# Patient Record
Sex: Female | Born: 1970 | Race: Black or African American | Hispanic: No | Marital: Married | Smoking: Never smoker
Health system: Southern US, Community
[De-identification: ages and names within clinical notes are randomized; demographics above are authoritative.]

---

## 1998-09-30 ENCOUNTER — Other Ambulatory Visit: Admission: RE | Admit: 1998-09-30 | Discharge: 1998-09-30 | Payer: Self-pay | Admitting: Obstetrics and Gynecology

## 1999-01-15 ENCOUNTER — Ambulatory Visit (HOSPITAL_COMMUNITY): Admission: RE | Admit: 1999-01-15 | Discharge: 1999-01-15 | Payer: Self-pay | Admitting: Obstetrics and Gynecology

## 1999-10-12 ENCOUNTER — Other Ambulatory Visit: Admission: RE | Admit: 1999-10-12 | Discharge: 1999-10-12 | Payer: Self-pay | Admitting: Obstetrics and Gynecology

## 1999-11-02 ENCOUNTER — Ambulatory Visit (HOSPITAL_COMMUNITY): Admission: RE | Admit: 1999-11-02 | Discharge: 1999-11-02 | Payer: Self-pay | Admitting: Gastroenterology

## 2000-11-01 ENCOUNTER — Other Ambulatory Visit: Admission: RE | Admit: 2000-11-01 | Discharge: 2000-11-01 | Payer: Self-pay | Admitting: Obstetrics and Gynecology

## 2001-10-31 ENCOUNTER — Other Ambulatory Visit: Admission: RE | Admit: 2001-10-31 | Discharge: 2001-10-31 | Payer: Self-pay | Admitting: Obstetrics and Gynecology

## 2016-02-17 ENCOUNTER — Other Ambulatory Visit: Payer: Self-pay | Admitting: Osteopathic Medicine

## 2016-02-17 ENCOUNTER — Encounter: Payer: Self-pay | Admitting: Osteopathic Medicine

## 2016-02-17 ENCOUNTER — Ambulatory Visit (INDEPENDENT_AMBULATORY_CARE_PROVIDER_SITE_OTHER): Payer: BLUE CROSS/BLUE SHIELD | Admitting: Osteopathic Medicine

## 2016-02-17 VITALS — BP 129/88 | HR 80 | Ht 62.0 in | Wt 122.0 lb

## 2016-02-17 DIAGNOSIS — N926 Irregular menstruation, unspecified: Secondary | ICD-10-CM | POA: Insufficient documentation

## 2016-02-17 DIAGNOSIS — Z79899 Other long term (current) drug therapy: Secondary | ICD-10-CM

## 2016-02-17 DIAGNOSIS — Z008 Encounter for other general examination: Secondary | ICD-10-CM

## 2016-02-17 DIAGNOSIS — Z0189 Encounter for other specified special examinations: Secondary | ICD-10-CM

## 2016-02-17 DIAGNOSIS — R21 Rash and other nonspecific skin eruption: Secondary | ICD-10-CM | POA: Insufficient documentation

## 2016-02-17 MED ORDER — HYDROXYZINE HCL 50 MG PO TABS: 25.0000 mg | ORAL_TABLET | Freq: Three times a day (TID) | ORAL | Status: DC | PRN

## 2016-02-17 MED ORDER — METHYLPREDNISOLONE 4 MG PO TBPK: ORAL_TABLET | ORAL | Status: DC

## 2016-02-17 NOTE — Progress Notes (Signed)
HPI: Krista Nunez is a 45 y.o. female who presents to [facility] today for chief complaint of:  Chief Complaint  Patient presents with  . Establish Care  . Rash    RASH . Location: L arm and R thigh . Quality: itching . Context:  Pt shows photos of what lesions looked like in the beginning 2 weeks ago, (+) erythema and urticarial papules, some visible subcutaneous edema surrounding. She was in [facility], did go hiking outside, noticed the lesions pop up on her plane ride home. Uncertain what if any possible exposure on her travels.  . Timing: both started same time 02/04/16 2 weeks ago . Modifying factors: Rash started 02/04/16 and she went to [facility] 02/05/16, Doxycycline was started then, too.  . Assoc signs/symptoms: no fever/chills, no joint pain.    Past medical, social and family history reviewed: History reviewed. No pertinent past medical history. No past surgical history on file. Social History  Substance Use Topics  . Smoking status: Never Smoker   . Smokeless tobacco: Not on file  . Alcohol Use: No   No family history on file.  No current outpatient prescriptions on file.   No current facility-administered medications for this visit.   Allergies  Allergen Reactions  . Sulfa Antibiotics Hives      Review of Systems: CONSTITUTIONAL:  No  fever, no chills, No recent illness, No unintentional weight changes HEAD/EYES/EARS/NOSE/THROAT: No  headache, no vision change, no hearing change, No sore throat, No  sinus pressure CARDIAC: No  chest pain, No  pressure, No palpitations, No  orthopnea RESPIRATORY: No  cough, No  shortness of breath/wheeze GASTROINTESTINAL: No  nausea, No  vomiting, No  abdominal pain, No  blood in stool, No  diarrhea, No  constipation  MUSCULOSKELETAL: No  myalgia/arthralgia GENITOURINARY: No  incontinence, No  abnormal genital bleeding/discharge SKIN: (+) rash/wounds/concerning lesions HEM/ONC: No  easy  bruising/bleeding, No  abnormal lymph node ENDOCRINE: No polyuria/polydipsia/polyphagia, No  heat/cold intolerance  NEUROLOGIC: No  weakness, No  dizziness, No  slurred speech PSYCHIATRIC: No  concerns with depression, No  concerns with anxiety, (+) sleep problems  Exam:  BP 129/88 mmHg  Pulse 80  Ht 5\' 2"  (1.575 m)  Wt 122 lb (55.339 kg)  BMI 22.31 kg/m2  LMP 01/11/2016 Constitutional: VS see above. General Appearance: alert, well-developed, well-nourished, NAD Eyes: Normal lids and conjunctive, non-icteric sclera Ears, Nose, Mouth, Throat: MMM, Normal external inspection ears/nares/mouth/lips Neck: No masses, trachea midline. No thyroid enlargement. No tenderness/mass appreciated. No lymphadenopathy Respiratory: Normal respiratory effort. no wheeze, no rhonchi, no rales Cardiovascular: S1/S2 normal, no murmur, no rub/gallop auscultated. RRR. No lower extremity edema. Musculoskeletal: Gait normal. No clubbing/cyanosis of digits.  Neurological: No cranial nerve deficit on limited exam. Motor and sensation intact and symmetric Skin: (+) papular rash, pruritic, L forearm and R thigh, no erythema, scattered papules, nondraining, nonulcerated.  warm, dry, intact. No rash/ulcer. No concerning nevi or subq nodules on limited exam.   Psychiatric: Normal judgment/insight. Normal mood and affect. Oriented x3.    No results found for this or any previous visit (from the past 72 hour(s)).  No results found.   ASSESSMENT/PLAN: Urticaria reaction possible dermatitis, was treated with abx by urgent care but it doesn't appear to be consistent with infection to me. Pt shows photos of what lesions looked like in the beginning, (+) erythema and urticarial papules, some visible subcutaneous edema surrounding. She was in [facility], did go hiking outside,  noticed the lesions pop up on her plane ride home. Uncertain what if any possible exposure on her travels. Biopsy if urticaria treatment doesn't help, or  if worse, consider possible inflammatory condition.   Rash and nonspecific skin eruption - urticaria possible allergic dermatitis, possible HSV/VZV but nonpainful/nonburning, staph infection unlikely, trial steroids and tx itch, bx if no better - Plan: methylPREDNISolone (MEDROL DOSEPAK) 4 MG TBPK tablet, hydrOXYzine (ATARAX/VISTARIL) 50 MG tablet  Encounter for biometric screening - Plan: Lipid panel, Glucose, CBC  Medication management - Plan: Pregnancy, urine  Irregular periods - reports perimenopause, will check UPT - Plan: Pregnancy, urine   All questions were answered. Visit summary with medication list and pertinent instructions was printed for patient to review. ER/RTC precautions were reviewed with the patient. Return if symptoms worsen or fail to improve / based on lab results.

## 2016-02-18 LAB — LIPID PANEL
CHOLESTEROL: 144 mg/dL (ref 125–200)
HDL: 60 mg/dL (ref >=46)
LDL Cholesterol: 58 mg/dL (ref <130)
Total CHOL/HDL Ratio: 2.4 Ratio (ref <=5.0)
Triglycerides: 131 mg/dL (ref <150)
VLDL: 26 mg/dL (ref <30)

## 2016-02-18 LAB — CBC
HEMATOCRIT: 37.8 % (ref 35.0–45.0)
HEMOGLOBIN: 12.5 g/dL (ref 11.7–15.5)
MCH: 28.1 pg (ref 27.0–33.0)
MCHC: 33.1 g/dL (ref 32.0–36.0)
MCV: 84.9 fL (ref 80.0–100.0)
MPV: 9.7 fL (ref 7.5–12.5)
Platelets: 247 10*3/uL (ref 140–400)
RBC: 4.45 MIL/uL (ref 3.80–5.10)
RDW: 14.8 % (ref 11.0–15.0)
WBC: 3 10*3/uL — AB (ref 3.8–10.8)

## 2016-02-18 LAB — GLUCOSE, RANDOM: GLUCOSE: 84 mg/dL (ref 65–99)

## 2016-02-19 ENCOUNTER — Telehealth: Payer: Self-pay | Admitting: *Deleted

## 2016-02-19 NOTE — Telephone Encounter (Signed)
Form faxed to [facility], confirmation received and scanned.Krista Nunez

## 2016-02-19 NOTE — Telephone Encounter (Signed)
Pt informed of results. She inquired about the pregnancy test I told her that it was not resulted. And that I would call the lab to have this added on if I could.  Called [facility] and spoke w/Brandy asked that she add on a serum pregnancy to [id number].Audelia Hives Alexander

## 2016-02-20 LAB — HCG, SERUM, QUALITATIVE: PREG SERUM: NEGATIVE

## 2016-02-22 ENCOUNTER — Telehealth: Payer: Self-pay | Admitting: Osteopathic Medicine

## 2016-02-22 NOTE — Telephone Encounter (Signed)
Please call patient: I'm just checking in to see whether her rash is better or not - if no better or if worse we can schedule her to come in for biopsy. Thanks!

## 2016-02-23 NOTE — Telephone Encounter (Signed)
SPOKE TO PATIENT SHE STATED THAT RASH IS BETTER. Rhonda Cunningham,CMA

## 2016-04-20 ENCOUNTER — Ambulatory Visit (INDEPENDENT_AMBULATORY_CARE_PROVIDER_SITE_OTHER): Payer: BLUE CROSS/BLUE SHIELD | Admitting: Osteopathic Medicine

## 2016-04-20 ENCOUNTER — Encounter: Payer: Self-pay | Admitting: Osteopathic Medicine

## 2016-04-20 ENCOUNTER — Other Ambulatory Visit: Payer: Self-pay | Admitting: Osteopathic Medicine

## 2016-04-20 VITALS — BP 143/78 | HR 86 | Ht 62.0 in | Wt 122.0 lb

## 2016-04-20 DIAGNOSIS — F411 Generalized anxiety disorder: Secondary | ICD-10-CM | POA: Diagnosis not present

## 2016-04-20 DIAGNOSIS — R002 Palpitations: Secondary | ICD-10-CM | POA: Diagnosis not present

## 2016-04-20 DIAGNOSIS — D72819 Decreased white blood cell count, unspecified: Secondary | ICD-10-CM

## 2016-04-20 MED ORDER — PROPRANOLOL HCL 20 MG PO TABS: 20.0000 mg | ORAL_TABLET | Freq: Three times a day (TID) | ORAL | 0 refills | Status: DC | PRN

## 2016-04-20 NOTE — Progress Notes (Signed)
HPI: Krista Nunez is a 45 y.o. female  who presents to [facility] today, 04/20/16,  for chief complaint of:  Chief Complaint  Patient presents with  . Anxiety     . Context: Previous episode seen in ER and D/C home on GERD tx  . Location: chest . Quality: Feels like chest pressure with occasional fast heart beat . Timing: few days of the week, seems worse lately, happens with stress  . Assoc signs/symptoms: No SI/HI, no panic, no GERD, no chest pain or dyspnea on exertion     Past medical, surgical, social and family history reviewed: No past medical history on file. Past Surgical History:  Procedure Laterality Date  . CESAREAN SECTION     Social History  Substance Use Topics  . Smoking status: Never Smoker  . Smokeless tobacco: Not on file  . Alcohol use No   No family history on file.   Current medication list and allergy/intolerance information reviewed:   No current outpatient prescriptions on file.   No current facility-administered medications for this visit.    Allergies  Allergen Reactions  . Sulfa Antibiotics Hives      Review of Systems:  Constitutional:  No  fever, no chills, No recent illness, No unintentional weight changes. No significant fatigue.   HEENT: No  headache, no vision change, no hearing change, No sore throat, No  sinus pressure  Cardiac: No  chest pain, (+) pressure, (+) palpitations,  Respiratory:  No  shortness of breath. No  Cough  Gastrointestinal: No  abdominal pain, No  nausea, No  vomiting,  No  blood in stool, No  diarrhea, No  constipation No GERD  Neurologic: No  weakness, No  dizziness  Psychiatric: No  concerns with depression, (+) concerns with anxiety, No sleep problems, No mood problems  Exam:  BP (!) 143/78   Pulse 86   Ht 5\' 2"  (1.575 m)   Wt 122 lb (55.3 kg)   BMI 22.31 kg/m   Constitutional: VS see above. General Appearance: alert, well-developed, well-nourished,  NAD  Ears, Nose, Mouth, Throat: MMM, Normal external inspection ears/nares/mouth/lips/gums.   Neck: No masses, trachea midline. No thyroid enlargement. No tenderness/mass appreciated. No lymphadenopathy  Respiratory: Normal respiratory effort. no wheeze, no rhonchi, no rales  Cardiovascular: S1/S2 normal, no murmur, no rub/gallop auscultated. RRR. No lower extremity edema. Chest wall nontender  Gastrointestinal: Nontender, no masses.  Skin: warm, dry, intact.   Psychiatric: Normal judgment/insight. Normal mood and affect. Oriented x3.    Results for orders placed or performed in visit on 04/20/16 (from the past 24 hour(s))  TSH     Status: None   Collection Time: 04/20/16  4:32 PM  Result Value Ref Range   TSH 2.06 mIU/L   Narrative   Performed at:  [facility], [id number]                [place], [place] [address]  CBC     Status: Abnormal   Collection Time: 04/20/16  4:32 PM  Result Value Ref Range   WBC 2.9 (L) 3.8 - 10.8 K/uL   RBC 4.52 3.80 - 5.10 MIL/uL   Hemoglobin 12.5 11.7 - 15.5 g/dL   HCT 37.8 35.0 - 45.0 %   MCV 83.6 80.0 - 100.0 fL   MCH 27.7 27.0 - 33.0 pg   MCHC 33.1  32.0 - 36.0 g/dL   RDW 14.2 11.0 - 15.0 %   Platelets 253 140 - 400 K/uL   MPV 9.9 7.5 - 12.5 fL   Narrative   Performed at:  [address], [id number]                [place], [place] [address]  COMPLETE METABOLIC PANEL WITH GFR     Status: Abnormal   Collection Time: 04/20/16  4:32 PM  Result Value Ref Range   Sodium 136 135 - 146 mmol/L   Potassium 3.6 3.5 - 5.3 mmol/L   Chloride 101 98 - 110 mmol/L   CO2 25 20 - 31 mmol/L   Glucose, Bld 82 65 - 99 mg/dL   BUN 8 7 - 25 mg/dL   Creat 0.82 0.50 - 1.10 mg/dL   Total Bilirubin 0.3 0.2 - 1.2 mg/dL   Alkaline Phosphatase 30 (L) 33 - 115 U/L   AST 16 10 - 30 U/L   ALT 11 6 - 29 U/L   Total Protein 7.2 6.1 - 8.1 g/dL   Albumin 4.2 3.6 - 5.1 g/dL   Calcium 9.0 8.6 -  10.2 mg/dL   GFR, Est African American >89 >=60 mL/min   GFR, Est Non African American 88 >=60 mL/min   Narrative   Performed at:  [address], [id number]                [place],  [address] SOURCE: BLD&BLOOD; SOURCE: BLD&BLOOD; SOURCE: BLD&BLOOD; SOU     ASSESSMENT/PLAN:   Patient seen and more concern in chest pain symptoms, she really only exclusively gets these with significant stress or anxiety, she will get palpitations/elevated heart rate around the same time. No chest pain otherwise, no chest pain on exertion.   Trial off label use propranolol, consider hydroxyzine, avoid benzodiazepines if possible, side effects to worry about were discussed with the patient.  Can consider referral for stress test, if chest pain does not improve with treatment or if anything else changes or gets worse. ER precautions were reviewed with the patient  Anxiety state - With autonomic symptoms - palpitations - Plan: TSH, CBC, COMPLETE METABOLIC PANEL WITH GFR  Palpitations - Plan: TSH, CBC, COMPLETE METABOLIC PANEL WITH GFR, EKG 12-Lead  Leukopenia - awaiting differential, white cells have been a bit low before     Visit summary with medication list and pertinent instructions was printed for patient to review. All questions at time of visit were answered - patient instructed to contact office with any additional concerns. ER/RTC precautions were reviewed with the patient. Follow-up plan: Return in about 6 weeks (around 06/01/2016), or sooner if needed, for ANXIETY MANAGEMENT.

## 2016-04-20 NOTE — Patient Instructions (Addendum)
Plan: 1. Labs to rule out complicating factors which may explain your symptoms (thyroid, electrolyte imbalance) 2. Medication as needed for stress/anxiety - Propranolol, start at low dose, can consider going up on the dose, but talk to me first 3. If propranolol isn't working, we have alternative medicines we can consider, and/or counseling. If you're having symptoms more days then not, and if symptoms are interfering with your ability to function normally at work or at home, then we should consider daily medication to prevent anxiety problems and help stabilize mood, prevent anxiety/chest pain problems and help with coping with stress. 4. If chest pain persists or worsens, we will need to consider further workup.

## 2016-04-21 DIAGNOSIS — R002 Palpitations: Secondary | ICD-10-CM | POA: Insufficient documentation

## 2016-04-21 DIAGNOSIS — F411 Generalized anxiety disorder: Secondary | ICD-10-CM

## 2016-04-21 DIAGNOSIS — D72819 Decreased white blood cell count, unspecified: Secondary | ICD-10-CM

## 2016-04-21 HISTORY — DX: Decreased white blood cell count, unspecified: D72.819

## 2016-04-21 HISTORY — DX: Generalized anxiety disorder: F41.1

## 2016-04-21 LAB — TSH: TSH: 2.06 m[IU]/L

## 2016-04-21 LAB — COMPLETE METABOLIC PANEL WITH GFR
ALT: 11 U/L (ref 6–29)
AST: 16 U/L (ref 10–30)
Albumin: 4.2 g/dL (ref 3.6–5.1)
Alkaline Phosphatase: 30 U/L — ABNORMAL LOW (ref 33–115)
BUN: 8 mg/dL (ref 7–25)
CHLORIDE: 101 mmol/L (ref 98–110)
CO2: 25 mmol/L (ref 20–31)
Calcium: 9 mg/dL (ref 8.6–10.2)
Creat: 0.82 mg/dL (ref 0.50–1.10)
GFR, Est African American: >89 mL/min (ref >=60)
GFR, Est Non African American: 88 mL/min (ref >=60)
GLUCOSE: 82 mg/dL (ref 65–99)
POTASSIUM: 3.6 mmol/L (ref 3.5–5.3)
SODIUM: 136 mmol/L (ref 135–146)
Total Bilirubin: 0.3 mg/dL (ref 0.2–1.2)
Total Protein: 7.2 g/dL (ref 6.1–8.1)

## 2016-04-21 LAB — CBC WITH DIFFERENTIAL/PLATELET
BASOS PCT: 1 %
Basophils Absolute: 28 cells/uL (ref 0–200)
EOS PCT: 1 %
Eosinophils Absolute: 28 cells/uL (ref 15–500)
HCT: 38.6 % (ref 35.0–45.0)
HEMOGLOBIN: 12.6 g/dL (ref 11.7–15.5)
LYMPHS ABS: 1232 {cells}/uL (ref 850–3900)
Lymphocytes Relative: 44 %
MCH: 28.3 pg (ref 27.0–33.0)
MCHC: 32.6 g/dL (ref 32.0–36.0)
MCV: 86.7 fL (ref 80.0–100.0)
MONOS PCT: 16 %
MPV: 10.1 fL (ref 7.5–12.5)
Monocytes Absolute: 448 cells/uL (ref 200–950)
NEUTROS ABS: 1064 {cells}/uL — AB (ref 1500–7800)
Neutrophils Relative %: 38 %
PLATELETS: 257 10*3/uL (ref 140–400)
RBC: 4.45 MIL/uL (ref 3.80–5.10)
RDW: 14.3 % (ref 11.0–15.0)
WBC: 2.8 10*3/uL — ABNORMAL LOW (ref 3.8–10.8)

## 2016-04-21 LAB — CBC
HCT: 37.8 % (ref 35.0–45.0)
Hemoglobin: 12.5 g/dL (ref 11.7–15.5)
MCH: 27.7 pg (ref 27.0–33.0)
MCHC: 33.1 g/dL (ref 32.0–36.0)
MCV: 83.6 fL (ref 80.0–100.0)
MPV: 9.9 fL (ref 7.5–12.5)
PLATELETS: 253 10*3/uL (ref 140–400)
RBC: 4.52 MIL/uL (ref 3.80–5.10)
RDW: 14.2 % (ref 11.0–15.0)
WBC: 2.9 10*3/uL — AB (ref 3.8–10.8)

## 2016-04-21 NOTE — Assessment & Plan Note (Signed)
Awaiting diff.

## 2016-06-30 ENCOUNTER — Ambulatory Visit: Payer: BLUE CROSS/BLUE SHIELD | Admitting: Sports Medicine

## 2017-02-14 ENCOUNTER — Emergency Department (INDEPENDENT_AMBULATORY_CARE_PROVIDER_SITE_OTHER)
Admission: EM | Admit: 2017-02-14 | Discharge: 2017-02-14 | Disposition: A | Discharge status: Home / Self Care | Payer: BLUE CROSS/BLUE SHIELD | Source: Home / Self Care | Attending: Family Medicine | Admitting: Family Medicine

## 2017-02-14 ENCOUNTER — Encounter: Payer: Self-pay | Admitting: *Deleted

## 2017-02-14 DIAGNOSIS — M7661 Achilles tendinitis, right leg: Secondary | ICD-10-CM

## 2017-02-14 NOTE — ED Triage Notes (Signed)
Patient c/o sudden onset right posterior ankle/lower leg pain while walking. No injury. She has used ice and ace wrap. No previous injury.

## 2017-02-14 NOTE — ED Provider Notes (Signed)
CSN: [id number]     Arrival date & time 02/14/17  1633 History   First MD Initiated Contact with Patient 02/14/17 1650     Chief Complaint  Patient presents with  . Ankle Pain   (Consider location/radiation/quality/duration/timing/severity/associated sxs/prior Treatment) HPI Krista Nunez is a 46 y.o. female presenting to [facility] with c/o sudden onset Right posterior ankle/lower leg pain over her achilles tendon that started yesterday while walking.  Denies known injury. Denies stepping off a curb, twisting ankle, sudden movement, heavy lifting or new shoes.  Pain is aching and sore. Pain is 2/10 now. Pain improved after ibuprofen last night and using ice and ace wrap today.  No prior injury or surgery to that ankle.   History reviewed. No pertinent past medical history. Past Surgical History:  Procedure Laterality Date  . CESAREAN SECTION     Family History  Problem Relation Age of Onset  . Hypertension Mother   . Hypertension Father   . Rheum arthritis Father   . Hepatitis C Father   . Lupus Father    Social History  Substance Use Topics  . Smoking status: Never Smoker  . Smokeless tobacco: Never Used  . Alcohol use No   OB History    No data available     Review of Systems  Musculoskeletal: Positive for arthralgias and myalgias. Negative for gait problem and joint swelling.  Skin: Negative for color change, rash and wound.  Neurological: Negative for weakness and numbness.    Allergies  Sulfa antibiotics  Home Medications   Prior to Admission medications   Medication Sig Start Date End Date Taking? Authorizing Provider  fluticasone (FLONASE) 50 MCG/ACT nasal spray Place into both nostrils daily.   Yes [provider]  propranolol (INDERAL) 20 MG tablet Take 1 tablet (20 mg total) by mouth 3 (three) times daily as needed (anxiety/palpitations). 04/20/16   Emeterio Reeve, DO   Meds Ordered and Administered this Visit  Medications - No data to  display  BP (!) 150/87 (BP Location: Left Arm)   Pulse 70   Wt 126 lb (57.2 kg)   LMP 02/12/2017   SpO2 100%   BMI 23.05 kg/m  No data found.   Physical Exam  Constitutional: She is oriented to person, place, and time. She appears well-developed and well-nourished.  HENT:  Head: Normocephalic and atraumatic.  Eyes: EOM are normal.  Neck: Normal range of motion.  Cardiovascular: Normal rate.   Pulmonary/Chest: Effort normal.  Musculoskeletal: Normal range of motion. She exhibits tenderness. She exhibits no edema.  Right leg: full ROM knee and ankle. Tenderness at origin of achilles tendon. No obvious edema. No erythema or warmth.  No bony tenderness. 5/5 strength with plantarflexion and dorsiflexion.   Neurological: She is alert and oriented to person, place, and time.  Skin: Skin is warm and dry.  Psychiatric: She has a normal mood and affect. Her behavior is normal.  Nursing note and vitals reviewed.   Urgent Care Course     Procedures (including critical care time)  Labs Review Labs Reviewed - No data to display  Imaging Review No results found.    MDM   1. Achilles tendinitis of right lower extremity    Hx and exam c/w achilles tendinitis  Doubt DVT, achilles ruputure or bony injury at this time. No indication for imaging at this time.  Will treat conservatively with ankle brace, rest, ice, compression, elevation. Alternate acetaminophen and ibuprofen.  Home exercises provided. Encouraged to wear  shoes with good arch and ankle support.  F/u with PCP or Sports Medicine in 1 week if not improving, sooner if worsening.     Noland Fordyce, PA-C 02/14/17 1704

## 2017-02-14 NOTE — Discharge Instructions (Signed)
° °  You may take 500mg  acetaminophen every 4-6 hours or in combination with ibuprofen 400-600mg  every 6-8 hours.    You may use a cool compress 2-3 times daily for 15 minutes at a time while keeping your foot elevated.    Avoid sprinting, running, heavy lifting and limit stair use until pain has resolved.

## 2017-03-14 ENCOUNTER — Other Ambulatory Visit: Payer: Self-pay | Admitting: Osteopathic Medicine

## 2017-03-14 MED ORDER — PROPRANOLOL HCL 20 MG PO TABS: 20.0000 mg | ORAL_TABLET | Freq: Three times a day (TID) | ORAL | 0 refills | Status: DC | PRN

## 2017-03-14 NOTE — Telephone Encounter (Signed)
Left VM for Pt advising of Rx refill and PCP recommendations. Callback information provided for scheduling.

## 2017-03-14 NOTE — Telephone Encounter (Signed)
Okay to refill. I had wanted her to follow-up to recheck anxiety but I assume since she did not come for follow-up that the medication is working well. I encouraged her to follow-up for her annual as discussed, please make patient aware that if anxiety requires further discussion, we may not be able to do an annual visit plus a problem-based visit for this issue at the same time if she needs to schedule to appointments.

## 2017-04-06 ENCOUNTER — Encounter: Payer: Self-pay | Admitting: Osteopathic Medicine

## 2017-04-06 ENCOUNTER — Ambulatory Visit (INDEPENDENT_AMBULATORY_CARE_PROVIDER_SITE_OTHER): Payer: BLUE CROSS/BLUE SHIELD | Admitting: Osteopathic Medicine

## 2017-04-06 VITALS — BP 125/77 | HR 93 | Ht 62.0 in | Wt 129.0 lb

## 2017-04-06 DIAGNOSIS — Z0000 Encounter for general adult medical examination without abnormal findings: Secondary | ICD-10-CM | POA: Diagnosis not present

## 2017-04-06 DIAGNOSIS — Z23 Encounter for immunization: Secondary | ICD-10-CM

## 2017-04-06 DIAGNOSIS — Z0189 Encounter for other specified special examinations: Secondary | ICD-10-CM | POA: Diagnosis not present

## 2017-04-06 DIAGNOSIS — Z008 Encounter for other general examination: Secondary | ICD-10-CM

## 2017-04-06 NOTE — Progress Notes (Signed)
HPI: Krista Nunez is a 46 y.o. female  who presents to [facility] today, 04/06/17,  for chief complaint of:  Chief Complaint  Patient presents with  . Annual Exam    Patient here for annual physical / wellness exam.  See preventive care reviewed as below.  Recent labs reviewed in detail with the patient.   Additional concerns today include:  Biometric screening form to complete.      Past medical, surgical, social and family history reviewed: Patient Active Problem List   Diagnosis Date Noted  . Leukopenia 04/21/2016  . Palpitations 04/21/2016  . Anxiety state 04/21/2016  . Rash and nonspecific skin eruption 02/17/2016  . Irregular periods 02/17/2016   Past Surgical History:  Procedure Laterality Date  . CESAREAN SECTION     Social History  Substance Use Topics  . Smoking status: Never Smoker  . Smokeless tobacco: Never Used  . Alcohol use No   Family History  Problem Relation Age of Onset  . Hypertension Mother   . Hypertension Father   . Rheum arthritis Father   . Hepatitis C Father   . Lupus Father      Current medication list and allergy/intolerance information reviewed:   Current Outpatient Prescriptions  Medication Sig Dispense Refill  . fluticasone (FLONASE) 50 MCG/ACT nasal spray Place into both nostrils daily.    . propranolol (INDERAL) 20 MG tablet Take 1 tablet (20 mg total) by mouth 3 (three) times daily as needed (anxiety/palpitations). 30 tablet 0   No current facility-administered medications for this visit.    Allergies  Allergen Reactions  . Sulfa Antibiotics Hives      Review of Systems:  Constitutional: No recent illness, No unintentional weight changes.   HEENT: No  headache, no vision change  Cardiac: No  chest pain, No  pressure, No palpitations  Respiratory:  No  shortness of breath. No  Cough  Gastrointestinal: No  abdominal pain, No  nausea, No  vomiting,  No  blood in stool, No   diarrhea, No  constipation   Musculoskeletal: No new myalgia/arthralgia  Skin: No  Rash   Hem/Onc: No  easy bruising/bleeding   Neurologic: No  weakness, No  dizziness  Psychiatric: No  concerns with depression, No  concerns with anxiety, No sleep problems, No mood problems  Exam:  BP 125/77   Pulse 93   Ht 5\' 2"  (1.575 m)   Wt 129 lb (58.5 kg)   LMP 02/12/2017   BMI 23.59 kg/m   Constitutional: VS see above. General Appearance: alert, well-developed, well-nourished, NAD  Eyes: Normal lids and conjunctive, non-icteric sclera  Ears, Nose, Mouth, Throat: MMM, Normal external inspection ears/nares/mouth/lips/gums. TM normal bilaterally. Pharynx/tonsils no erythema, no exudate. Nasal mucosa normal.   Neck: No masses, trachea midline. No thyroid enlargement. No tenderness/mass appreciated. No lymphadenopathy  Respiratory: Normal respiratory effort. no wheeze, no rhonchi, no rales  Cardiovascular: S1/S2 normal, no murmur, no rub/gallop auscultated. RRR. No lower extremity edema.   Gastrointestinal: Nontender, no masses. No hepatomegaly, no splenomegaly. No hernia appreciated. Bowel sounds normal. Rectal exam deferred.   Musculoskeletal: Gait normal. No clubbing/cyanosis of digits.   Neurological: Normal balance/coordination. No tremor. No cranial nerve deficit on limited exam.   Skin: warm, dry, intact. No rash/ulcer. No concerning nevi or subq nodules on limited exam.    Psychiatric: Normal judgment/insight. Normal mood and affect. Oriented x3.     ASSESSMENT/PLAN:   Annual physical exam - Plan: CBC, COMPLETE METABOLIC  PANEL WITH GFR, Lipid panel, Hemoglobin A1c, TSH  Encounter for biometric screening - Plan: CBC, COMPLETE METABOLIC PANEL WITH GFR, Lipid panel, Hemoglobin A1c, TSH  Need for tetanus booster - Plan: Tdap vaccine greater than or equal to 7yo IM   FEMALE PREVENTIVE CARE Updated 04/06/17   ANNUAL SCREENING/COUNSELING  Diet/Exercise - HEALTHY HABITS  DISCUSSED TO DECREASE CV RISK History  Smoking Status  . Never Smoker  Smokeless Tobacco  . Never Used   History  Alcohol Use No  occasional   Depression screen PHQ 2/9 04/06/2017  Decreased Interest 0  Down, Depressed, Hopeless 1  PHQ - 2 Score 1    Domestic violence concerns - no  HTN SCREENING - SEE [facility]  Sexually active in the past year - Yes with female.  Need/want STI testing today? - no  Concerns about libido or pain with sex? - no  Plans for pregnancy? - none  INFECTIOUS DISEASE SCREENING  HIV - does not need  GC/CT - does not need  HepC - DOB [birth year] - does not need  TB - does not need  DISEASE SCREENING  Lipid - needs  DM2 - needs  Osteoporosis - does not need  CANCER SCREENING  Cervical - does not need - following with OBGYN  Breast - does not need - following with OBGYN  Lung - does not need  Colon - does not need - no FH   ADULT VACCINATION  Influenza - annual vaccine recommended  Td - booster every 10 years - due   Zoster - option at 4, yes at 60+   PCV13 - was not indicated  PPSV23 - was not indicated Immunization History  Administered Date(s) Administered  . Td 09/05/1994  . Tdap 04/06/2017       Visit summary with medication list and pertinent instructions was printed for patient to review. All questions at time of visit were answered - patient instructed to contact office with any additional concerns. ER/RTC precautions were reviewed with the patient. Follow-up plan: Return in about 1 year (around 04/06/2018) for [facility], sooner if needed .

## 2017-04-07 LAB — COMPLETE METABOLIC PANEL WITH GFR
ALK PHOS: 43 U/L (ref 33–115)
ALT: 17 U/L (ref 6–29)
AST: 18 U/L (ref 10–30)
Albumin: 4.1 g/dL (ref 3.6–5.1)
BILIRUBIN TOTAL: 0.4 mg/dL (ref 0.2–1.2)
BUN: 15 mg/dL (ref 7–25)
CALCIUM: 9.4 mg/dL (ref 8.6–10.2)
CHLORIDE: 102 mmol/L (ref 98–110)
CO2: 28 mmol/L (ref 20–31)
CREATININE: 0.79 mg/dL (ref 0.50–1.10)
GFR, Est Non African American: >89 mL/min (ref >=60)
Glucose, Bld: 101 mg/dL — ABNORMAL HIGH (ref 65–99)
Potassium: 3.6 mmol/L (ref 3.5–5.3)
Sodium: 139 mmol/L (ref 135–146)
Total Protein: 7.2 g/dL (ref 6.1–8.1)

## 2017-04-07 LAB — LIPID PANEL
CHOLESTEROL: 150 mg/dL (ref <200)
HDL: 65 mg/dL (ref >50)
LDL Cholesterol: 64 mg/dL (ref <100)
TRIGLYCERIDES: 106 mg/dL (ref <150)
Total CHOL/HDL Ratio: 2.3 Ratio (ref <5.0)
VLDL: 21 mg/dL (ref <30)

## 2017-04-07 LAB — HEMOGLOBIN A1C
Hgb A1c MFr Bld: 5.4 % (ref <5.7)
Mean Plasma Glucose: 108 mg/dL

## 2017-04-07 LAB — CBC
HEMATOCRIT: 36.5 % (ref 35.0–45.0)
Hemoglobin: 11.9 g/dL (ref 11.7–15.5)
MCH: 27.9 pg (ref 27.0–33.0)
MCHC: 32.6 g/dL (ref 32.0–36.0)
MCV: 85.7 fL (ref 80.0–100.0)
MPV: 9.7 fL (ref 7.5–12.5)
Platelets: 268 10*3/uL (ref 140–400)
RBC: 4.26 MIL/uL (ref 3.80–5.10)
RDW: 14.5 % (ref 11.0–15.0)
WBC: 3.1 10*3/uL — ABNORMAL LOW (ref 3.8–10.8)

## 2017-04-07 LAB — TSH: TSH: 1.28 m[IU]/L

## 2017-06-22 ENCOUNTER — Encounter: Payer: Self-pay | Admitting: Osteopathic Medicine

## 2017-06-22 ENCOUNTER — Ambulatory Visit (INDEPENDENT_AMBULATORY_CARE_PROVIDER_SITE_OTHER): Payer: BLUE CROSS/BLUE SHIELD | Admitting: Osteopathic Medicine

## 2017-06-22 VITALS — BP 127/85 | HR 85 | Temp 97.6°F | Ht 63.0 in | Wt 130.0 lb

## 2017-06-22 DIAGNOSIS — B9789 Other viral agents as the cause of diseases classified elsewhere: Secondary | ICD-10-CM

## 2017-06-22 DIAGNOSIS — J069 Acute upper respiratory infection, unspecified: Secondary | ICD-10-CM

## 2017-06-22 DIAGNOSIS — J029 Acute pharyngitis, unspecified: Secondary | ICD-10-CM | POA: Diagnosis not present

## 2017-06-22 LAB — POCT RAPID STREP A (OFFICE): RAPID STREP A SCREEN: NEGATIVE

## 2017-06-22 MED ORDER — METHYLPREDNISOLONE 4 MG PO TBPK: ORAL_TABLET | ORAL | 0 refills | Status: DC

## 2017-06-22 MED ORDER — BENZONATATE 200 MG PO CAPS: 200.0000 mg | ORAL_CAPSULE | Freq: Three times a day (TID) | ORAL | 1 refills | Status: DC | PRN

## 2017-06-22 MED ORDER — IPRATROPIUM BROMIDE 0.06 % NA SOLN: 2.0000 | Freq: Four times a day (QID) | NASAL | 1 refills | Status: DC

## 2017-06-22 NOTE — Patient Instructions (Signed)
Over-the-Counter Medications & Home Remedies for Upper Respiratory Illness  Note: the following list assumes no pregnancy, normal liver & kidney function and no other drug interactions. Dr. Sheppard Coil has highlighted medications which are safe for you to use, but these may not be appropriate for everyone. Always ask a pharmacist or qualified medical provider if you have any questions!   Aches/Pains, Fever, Headache Acetaminophen (Tylenol) 500 mg tablets - take max 2 tablets (1000 mg) every 6 hours (4 times per day)  Ibuprofen (Motrin) 200 mg tablets - take max 4 tablets (800 mg) every 6 hours*  Sinus Congestion Prescription Atrovent as directed Cromolyn Nasal Spray (NasalCrom) 1 spray each nostril 3-4 times per day, max 6 imes per day Nasal Saline if desired Oxymetolazone (Afrin, others) sparing use due to rebound congestion, NEVER use in kids Phenylephrine (Sudafed) 10 mg tablets every 4 hours (or the 12-hour formulation)* Diphenhydramine (Benadryl) 25 mg tablets - take max 2 tablets every 4 hours  Cough & Sore Throat Prescription cough pills or syrups as directed Dextromethorphan (Robitussin, others) - cough suppressant Guaifenesin (Robitussin, Mucinex, others) - expectorant (helps cough up mucus) (Dextromethorphan and Guaifenesin also come in a combination tablet) Lozenges w/ Benzocaine + Menthol (Cepacol) Honey - as much as you want! Teas which "coat the throat" - look for ingredients Elm Bark, Licorice Root, Marshmallow Root  Other Antibiotics if these are prescribed - take ALL, even if you're feeling better  Zinc Lozenges within 24 hours of symptoms onset - mixed evidence this shortens the duration of the common cold Don't waste your money on Vitamin C or Echinacea  *Caution in patients with high blood pressure

## 2017-06-22 NOTE — Progress Notes (Signed)
HPI: Krista Nunez is a 46 y.o. female  who presents to [facility] today, 06/22/17,  for chief complaint of:  Chief Complaint  Patient presents with  . Sore Throat    Sinus congestion, dry cough, sore throat for the past few days. Has tried over-the-counter Mucinex to some relief. Muscle fatigue/general malaise but no fever/chills  Past medical, surgical, social and family history reviewed: Patient Active Problem List   Diagnosis Date Noted  . Leukopenia 04/21/2016  . Palpitations 04/21/2016  . Anxiety state 04/21/2016  . Rash and nonspecific skin eruption 02/17/2016  . Irregular periods 02/17/2016   Past Surgical History:  Procedure Laterality Date  . CESAREAN SECTION     Social History  Substance Use Topics  . Smoking status: Never Smoker  . Smokeless tobacco: Never Used  . Alcohol use No   Family History  Problem Relation Age of Onset  . Hypertension Mother   . Hypertension Father   . Rheum arthritis Father   . Hepatitis C Father   . Lupus Father      Current medication list and allergy/intolerance information reviewed:   Current Outpatient Prescriptions  Medication Sig Dispense Refill  . fluticasone (FLONASE) 50 MCG/ACT nasal spray Place into both nostrils daily.    . propranolol (INDERAL) 20 MG tablet Take 1 tablet (20 mg total) by mouth 3 (three) times daily as needed (anxiety/palpitations). 30 tablet 0   No current facility-administered medications for this visit.    Allergies  Allergen Reactions  . Sulfa Antibiotics Hives      Review of Systems:  Constitutional:  No  fever, no chills, +recent illness, No unintentional weight changes. +significant fatigue.   HEENT: +headache, no vision change, no hearing change, +sore throat, +sinus pressure  Cardiac: No  chest pain, No  pressure, No palpitations  Respiratory:  No  shortness of breath. +Cough  Gastrointestinal: No  abdominal pain, No  nausea, No  vomiting,   No  blood in stool, No  diarrhea  Musculoskeletal: +myalgia, no arthralgia  Exam:  BP 127/85   Pulse 85   Temp 97.6 F (36.4 C) (Oral)   Ht 5\' 3"  (1.6 m)   Wt 130 lb (59 kg)   BMI 23.03 kg/m   Constitutional: VS see above. General Appearance: alert, well-developed, well-nourished, NAD  Eyes: Normal lids and conjunctive, non-icteric sclera  Ears, Nose, Mouth, Throat: MMM, Normal external inspection ears/nares/mouth/lips/gums. TM normal bilaterally. Pharynx/tonsils +erythema, no exudate. Nasal mucosa normal.   Neck: No masses, trachea midline. No thyroid enlargement. No tenderness/mass appreciated. No lymphadenopathy  Respiratory: Normal respiratory effort. no wheeze, no rhonchi, no rales  Cardiovascular: S1/S2 normal, no murmur, no rub/gallop auscultated. RRR.   Skin: warm, dry, intact. No rash/ulcer   Results for orders placed or performed in visit on 06/22/17 (from the past 72 hour(s))  POCT rapid strep A     Status: None   Collection Time: 06/22/17  2:18 PM  Result Value Ref Range   Rapid Strep A Screen Negative Negative      ASSESSMENT/PLAN:   Viral URI with cough - Plan: ipratropium (ATROVENT) 0.06 % nasal spray, benzonatate (TESSALON) 200 MG capsule, methylPREDNISolone (MEDROL DOSEPAK) 4 MG TBPK tablet  Sore throat - Plan: POCT rapid strep A    Patient Instructions  Over-the-Counter Medications & Home Remedies for Upper Respiratory Illness  Note: the following list assumes no pregnancy, normal liver & kidney function and no other drug interactions. Dr. Sheppard Coil has highlighted medications  which are safe for you to use, but these may not be appropriate for everyone. Always ask a pharmacist or qualified medical provider if you have any questions!   Aches/Pains, Fever, Headache Acetaminophen (Tylenol) 500 mg tablets - take max 2 tablets (1000 mg) every 6 hours (4 times per day)  Ibuprofen (Motrin) 200 mg tablets - take max 4 tablets (800 mg) every 6  hours*  Sinus Congestion Prescription Atrovent as directed Cromolyn Nasal Spray (NasalCrom) 1 spray each nostril 3-4 times per day, max 6 imes per day Nasal Saline if desired Oxymetolazone (Afrin, others) sparing use due to rebound congestion, NEVER use in kids Phenylephrine (Sudafed) 10 mg tablets every 4 hours (or the 12-hour formulation)* Diphenhydramine (Benadryl) 25 mg tablets - take max 2 tablets every 4 hours  Cough & Sore Throat Prescription cough pills or syrups as directed Dextromethorphan (Robitussin, others) - cough suppressant Guaifenesin (Robitussin, Mucinex, others) - expectorant (helps cough up mucus) (Dextromethorphan and Guaifenesin also come in a combination tablet) Lozenges w/ Benzocaine + Menthol (Cepacol) Honey - as much as you want! Teas which "coat the throat" - look for ingredients Elm Bark, Licorice Root, Marshmallow Root  Other Antibiotics if these are prescribed - take ALL, even if you're feeling better  Zinc Lozenges within 24 hours of symptoms onset - mixed evidence this shortens the duration of the common cold Don't waste your money on Vitamin C or Echinacea  *Caution in patients with high blood pressure       Visit summary with medication list and pertinent instructions was printed for patient to review. All questions at time of visit were answered - patient instructed to contact office with any additional concerns. ER/RTC precautions were reviewed with the patient. Follow-up plan: Return if symptoms worsen or fail to improve.  Note: Total time spent 15 minutes, greater than 50% of the visit was spent face-to-face counseling and coordinating care for the following: The primary encounter diagnosis was Viral URI with cough. A diagnosis of Sore throat was also pertinent to this visit..  Established: 54562- 15 / 99214- 25 / [place]- 40 New: 56389- 52 / 37342- 87 / 99205- 65

## 2017-08-24 ENCOUNTER — Ambulatory Visit (INDEPENDENT_AMBULATORY_CARE_PROVIDER_SITE_OTHER): Payer: BLUE CROSS/BLUE SHIELD

## 2017-08-24 ENCOUNTER — Encounter: Payer: Self-pay | Admitting: Osteopathic Medicine

## 2017-08-24 ENCOUNTER — Ambulatory Visit: Payer: BLUE CROSS/BLUE SHIELD | Admitting: Osteopathic Medicine

## 2017-08-24 VITALS — BP 128/82 | HR 90 | Temp 98.3°F | Wt 127.1 lb

## 2017-08-24 DIAGNOSIS — M25551 Pain in right hip: Secondary | ICD-10-CM

## 2017-08-24 DIAGNOSIS — F411 Generalized anxiety disorder: Secondary | ICD-10-CM | POA: Diagnosis not present

## 2017-08-24 MED ORDER — PREDNISONE 10 MG (48) PO TBPK: ORAL_TABLET | Freq: Every day | ORAL | 0 refills | Status: DC

## 2017-08-24 MED ORDER — MELOXICAM 15 MG PO TABS: 15.0000 mg | ORAL_TABLET | Freq: Every day | ORAL | 0 refills | Status: DC

## 2017-08-24 NOTE — Patient Instructions (Signed)
Plan: I suspect tendinitis/bursitis.   I printed some home exercises that may be helpfu.   I sent in and anti-inflammatory to take once per day, will start steroid taper as well. Okay to take Tylenol with these medicines.  We'll get x-ray of the hip to make sure nothing else is going on in the joint.   I went ahead and placed a referral for formal physical therapy if you decide you want to do this as well.   If it's not getting better within the next week or so, would make an appointment to see one of our sports medicine doctors. This may be something that benefits from a hip injection.

## 2017-08-24 NOTE — Progress Notes (Signed)
HPI: Krista Nunez is a 46 y.o. female who  has no past medical history on file.  she presents to [facility] today, 08/24/17,  for chief complaint of:  Chief Complaint  Patient presents with  . Hip Pain    Hip pain for few months now. R hip. Stiffness and at one point was feeling numbness in R leg going down anterior hip and into bottom of the foot - worst was after a long flight back from [place], hasn't bothered her too much since then. Sometimes walking and it hurts, more so with lying down.   Also requests FMLA for occasional days missed from work due to severe stress reaction. Has documented history of anxiety, takes propranolol as needed.     Past medical, surgical, social and family history reviewed:  Patient Active Problem List   Diagnosis Date Noted  . Leukopenia 04/21/2016  . Palpitations 04/21/2016  . Anxiety state 04/21/2016  . Rash and nonspecific skin eruption 02/17/2016  . Irregular periods 02/17/2016    Past Surgical History:  Procedure Laterality Date  . CESAREAN SECTION      Social History   Tobacco Use  . Smoking status: Never Smoker  . Smokeless tobacco: Never Used  Substance Use Topics  . Alcohol use: No    Alcohol/week: 0.0 oz    Family History  Problem Relation Age of Onset  . Hypertension Mother   . Hypertension Father   . Rheum arthritis Father   . Hepatitis C Father   . Lupus Father      Current medication list and allergy/intolerance information reviewed:    Current Outpatient Medications  Medication Sig Dispense Refill  . ipratropium (ATROVENT) 0.06 % nasal spray Place 2 sprays into both nostrils 4 (four) times daily. 15 mL 1  . propranolol (INDERAL) 20 MG tablet Take 1 tablet (20 mg total) by mouth 3 (three) times daily as needed (anxiety/palpitations). 30 tablet 0   No current facility-administered medications for this visit.     Allergies  Allergen Reactions  . Sulfa Antibiotics Hives       Review of Systems:  Constitutional:  No  fever, no chills, No recent illness  Cardiac: No  chest pain, No  pressure  Respiratory:  No  shortness of breath.   Gastrointestinal: No  abdominal pain,  Musculoskeletal: +myalgia/arthralgia  Genitourinary: No  incontinence  Skin: No  Rash  Neurologic: No  weakness, No  dizziness, +leg numbness as per HPI    Exam:  BP 128/82   Pulse 90   Temp 98.3 F (36.8 C) (Oral)   Wt 127 lb 1.3 oz (57.6 kg)   BMI 22.51 kg/m   Constitutional: VS see above. General Appearance: alert, well-developed, well-nourished, NAD  Ears, Nose, Mouth, Throat: MMM, Normal external inspection ears/nares/mouth/lips/gums.   Neck: No masses, trachea midline.   Respiratory: Normal respiratory effort.   Musculoskeletal: Gait normal. No clubbing/cyanosis of digits. (+)pain with log roll more to internal rotation and (+) pain with FABER as well, (+)tenderness trochanteric bursa, no ITB tightness  Neurological: Normal balance/coordination. No tremor. No cranial nerve deficit on limited exam. Motor and sensation intact and symmetric. Cerebellar reflexes intact.   Skin: warm, dry, intact. No rash/ulcer.  Psychiatric: Normal judgment/insight. Normal mood and affect. Oriented x3.      ASSESSMENT/PLAN:   Right hip pain - Suspect bursitis, sounds like bout of lateral femoral cutaneous nerve impingement. Trial steroid burst, NSAID, PT and f/u sports med if no  better  - Plan: DG HIP UNILAT W OR W/O PELVIS 2-3 VIEWS RIGHT, meloxicam (MOBIC) 15 MG tablet, predniSONE (STERAPRED UNI-PAK 48 TAB) 10 MG (48) TBPK tablet, Ambulatory referral to Physical Therapy  Anxiety state - FMLA forms completed, see scanned documents.    Patient Instructions  Plan: I suspect tendinitis/bursitis.   I printed some home exercises that may be helpfu.   I sent in and anti-inflammatory to take once per day, will start steroid taper as well. Okay to take Tylenol with these  medicines.  We'll get x-ray of the hip to make sure nothing else is going on in the joint.   I went ahead and placed a referral for formal physical therapy if you decide you want to do this as well.   If it's not getting better within the next week or so, would make an appointment to see one of our sports medicine doctors. This may be something that benefits from a hip injection.    Visit summary with medication list and pertinent instructions was printed for patient to review. All questions at time of visit were answered - patient instructed to contact office with any additional concerns. ER/RTC precautions were reviewed with the patient.   Follow-up plan: Return in about 1 week (around 08/31/2017) for recheck with sports med if no better .  Note: Total time spent 25 minutes, greater than 50% of the visit was spent face-to-face counseling and coordinating care for the following: The primary encounter diagnosis was Right hip pain. A diagnosis of Anxiety state was also pertinent to this visit.Marland Kitchen  Please note: voice recognition software was used to produce this document, and typos may escape review. Please contact Dr. Sheppard Coil for any needed clarifications.

## 2017-08-30 ENCOUNTER — Telehealth: Payer: Self-pay

## 2017-08-30 NOTE — Telephone Encounter (Signed)
Patient informed of provider's note. She will discontinue taking prednisone medication. Pt was transferred to front desk to schedule an appt with Dr. Georgina Snell.

## 2017-08-30 NOTE — Telephone Encounter (Signed)
Would recommend stopping the prednisone, would have her schedule a visit with Dr. Georgina Snell sometime this week to recheck the hip if it is still bothering her.

## 2017-08-30 NOTE — Telephone Encounter (Signed)
Patient called on Friday after office was closed. As per patient since taking prednisone, having rapid heart rate and upset stomach. Patient is on day 6 of medication and still having symptoms. Please advise, thanks!

## 2017-09-06 ENCOUNTER — Encounter: Payer: Self-pay | Admitting: Family Medicine

## 2017-09-06 ENCOUNTER — Ambulatory Visit: Payer: BLUE CROSS/BLUE SHIELD | Admitting: Family Medicine

## 2017-09-06 VITALS — BP 133/85 | HR 91 | Ht 62.0 in | Wt 132.0 lb

## 2017-09-06 DIAGNOSIS — M7061 Trochanteric bursitis, right hip: Secondary | ICD-10-CM | POA: Diagnosis not present

## 2017-09-06 NOTE — Progress Notes (Signed)
   Subjective:    I'm seeing this patient as a consultation for:  Emeterio Reeve, DO   CC: Right Hip Pain  HPI: Krista Nunez notes pain in the right lateral hip.  This is been ongoing now for several months.  The pain is worse with standing from a seated position and lying on her right side.  She also has some pain with prolonged sitting.  She works a Warden/ranger job.  She denies any radiating pain weakness or numbness.  The pain is severe at times and interferes with sleep.  She is tried some over-the-counter medicines for pain which have helped.  She had a relatively normal right hip x-ray last month.   Past medical history, Surgical history, Family history not pertinant except as noted below, Social history, Allergies, and medications have been entered into the medical record, reviewed, and no changes needed.   Review of Systems: No headache, visual changes, nausea, vomiting, diarrhea, constipation, dizziness, abdominal pain, skin rash, fevers, chills, night sweats, weight loss, swollen lymph nodes, body aches, joint swelling, muscle aches, chest pain, shortness of breath, mood changes, visual or auditory hallucinations.   Objective:    Vitals:   09/06/17 0811  BP: 133/85  Pulse: 91   General: Well Developed, well nourished, and in no acute distress.  Neuro/Psych: Alert and oriented x3, extra-ocular muscles intact, able to move all 4 extremities, sensation grossly intact. Skin: Warm and dry, no rashes noted.  Respiratory: Not using accessory muscles, speaking in full sentences, trachea midline.  Cardiovascular: Pulses palpable, no extremity edema. Abdomen: Does not appear distended. MSK:  Right hip normal-appearing Normal motion with. Tender to palpation greater trochanter. Hip abduction strength is diminished 4/5 bilaterally. Lower extremity reflexes and sensation are intact Normal gait.    CLINICAL DATA:  Right hip pain for 3 months.  No injury.  EXAM: DG HIP (WITH OR  WITHOUT PELVIS) 2-3V RIGHT  COMPARISON:  None.  FINDINGS: There is no evidence of hip fracture or dislocation. There is no evidence of arthropathy or other focal bone abnormality.  IMPRESSION: Negative.   Electronically Signed   By: Rolm Baptise M.D.   On: 08/24/2017 08:36  Impression and Recommendations:    Assessment and Plan: 47 y.o. female with right lateral hip pain is greater trochanteric bursitis based on exam today.  We spent time discussing home exercise program and will proceed with formal physical therapy if not improving.  Recheck as needed.   No orders of the defined types were placed in this encounter.  No orders of the defined types were placed in this encounter.   Discussed warning signs or symptoms. Please see discharge instructions. Patient expresses understanding.

## 2017-09-06 NOTE — Patient Instructions (Addendum)
Thank you for coming in today. Work on the lying stretches IT band and Figure 4 stretch Work on side leg raises. Up and back and big arc.  Work on slow step drills.  Try to sets twice daily.  If not getting better send me a note and I will order PT.  Recheck as needed if not better or if worse.   For pain 1-2 aleve twice daily is reasonable.  Take it with OTC pepcid or zantac for stomach acid protection.   Trochanteric Bursitis Trochanteric bursitis is a condition that causes hip pain. Trochanteric bursitis happens when fluid-filled sacs (bursae) in the hip get irritated. Normally these sacs absorb shock and help strong bands of tissue (tendons) in your hip glide smoothly over each other and over your hip bones. What are the causes? This condition results from increased friction between the hip bones and the tendons that go over them. This condition can happen if you:  Have weak hips.  Use your hip muscles too much (overuse).  Get hit in the hip.  What increases the risk? This condition is more likely to develop in:  Women.  Adults who are middle-aged or older.  People with arthritis or a spinal condition.  People with weak buttocks muscles (gluteal muscles).  People who have one leg that is shorter than the other.  People who participate in certain kinds of athletic activities, such as: ? Running sports, especially long-distance running. ? Contact sports, like football or martial arts. ? Sports in which falls may occur, like skiing.  What are the signs or symptoms? The main symptom of this condition is pain and tenderness over the point of your hip. The pain may be:  Sharp and intense.  Dull and achy.  Felt on the outside of your thigh.  It may increase when you:  Lie on your side.  Walk or run.  Go up on stairs.  Sit.  Stand up after sitting.  Stand for long periods of time.  How is this diagnosed? This condition may be diagnosed based on:  Your  symptoms.  Your medical history.  A physical exam.  Imaging tests, such as: ? X-rays to check your bones. ? An MRI or ultrasound to check your tendons and muscles.  During your physical exam, your health care provider will check the movement and strength of your hip. He or she may press on the point of your hip to check for pain. How is this treated? This condition may be treated by:  Resting.  Reducing your activity.  Avoiding activities that cause pain.  Using crutches, a cane, or a walker to decrease the strain on your hip.  Taking medicine to help with swelling.  Having medicine injected into the bursae to help with swelling.  Using ice, heat, and massage therapy for pain relief.  Physical therapy exercises for strength and flexibility.  Surgery (rare).  Follow these instructions at home: Activity  Rest.  Avoid activities that cause pain.  Return to your normal activities as told by your health care provider. Ask your health care provider what activities are safe for you. Managing pain, stiffness, and swelling  Take over-the-counter and prescription medicines only as told by your health care provider.  If directed, apply heat to the injured area as told by your health care provider. ? Place a towel between your skin and the heat source. ? Leave the heat on for 20-30 minutes. ? Remove the heat if your skin turns bright  red. This is especially important if you are unable to feel pain, heat, or cold. You may have a greater risk of getting burned.  If directed, apply ice to the injured area: ? Put ice in a plastic bag. ? Place a towel between your skin and the bag. ? Leave the ice on for 20 minutes, 2-3 times a day. General instructions  If the affected leg is one that you use for driving, ask your health care provider when it is safe to drive.  Use crutches, a cane, or a walker as told by your health care provider.  If one of your legs is shorter than the  other, get fitted for a shoe insert.  Lose weight if you are overweight. How is this prevented?  Wear supportive footwear that is appropriate for your sport.  If you have hip pain, start any new exercise or sport slowly.  Maintain physical fitness, including: ? Strength. ? Flexibility. Contact a health care provider if:  Your pain does not improve with 2-4 weeks. Get help right away if:  You develop severe pain.  You have a fever.  You develop increased redness over your hip.  You have a change in your bowel function or bladder function.  You cannot control the muscles in your feet. This information is not intended to replace advice given to you by your health care provider. Make sure you discuss any questions you have with your health care provider. Document Released: 09/29/2004 Document Revised: 04/27/2016 Document Reviewed: 08/07/2015 Elsevier Interactive Patient Education  [employer].

## 2017-09-13 LAB — HM MAMMOGRAPHY

## 2017-09-13 LAB — HM PAP SMEAR: HM Pap smear: NEGATIVE

## 2017-09-22 ENCOUNTER — Telehealth: Payer: Self-pay | Admitting: Osteopathic Medicine

## 2017-09-22 NOTE — Telephone Encounter (Signed)
Pt has been updated of provider's note. No other inquiries asked during phone call.

## 2017-09-22 NOTE — Telephone Encounter (Signed)
Please call patient: I completed her FMLA paperwork, it should be up front for her to come to the office and pick up. If the alterations I made are not accepted by her employer, she will need an office visit to go over the paperwork in detail.

## 2017-09-26 ENCOUNTER — Telehealth: Payer: Self-pay | Admitting: Osteopathic Medicine

## 2017-09-26 NOTE — Telephone Encounter (Signed)
Please call patient: FMLA forms updated (for second time). Forms up front to pick up. If this edit is still not adequate she will need to come in for a visit to discuss in detail.

## 2017-09-26 NOTE — Telephone Encounter (Signed)
Called pt to notify paperwork is ready to p/up 1/22 @ 100pm l/m on voicemail.

## 2018-05-09 ENCOUNTER — Ambulatory Visit (INDEPENDENT_AMBULATORY_CARE_PROVIDER_SITE_OTHER): Payer: BLUE CROSS/BLUE SHIELD

## 2018-05-09 ENCOUNTER — Encounter: Payer: Self-pay | Admitting: Osteopathic Medicine

## 2018-05-09 ENCOUNTER — Ambulatory Visit (INDEPENDENT_AMBULATORY_CARE_PROVIDER_SITE_OTHER): Payer: BLUE CROSS/BLUE SHIELD | Admitting: Osteopathic Medicine

## 2018-05-09 VITALS — BP 123/75 | HR 88 | Temp 98.2°F | Wt 130.5 lb

## 2018-05-09 DIAGNOSIS — Z0000 Encounter for general adult medical examination without abnormal findings: Secondary | ICD-10-CM

## 2018-05-09 DIAGNOSIS — G8929 Other chronic pain: Secondary | ICD-10-CM

## 2018-05-09 DIAGNOSIS — M25512 Pain in left shoulder: Secondary | ICD-10-CM

## 2018-05-09 MED ORDER — PROPRANOLOL HCL 20 MG PO TABS: 20.0000 mg | ORAL_TABLET | Freq: Three times a day (TID) | ORAL | 0 refills | Status: DC | PRN

## 2018-05-09 NOTE — Progress Notes (Signed)
HPI: Krista Nunez is a 47 y.o. female who  has no past medical history on file.  she presents to [facility] today, 05/09/18,  for chief complaint of: Annual Physical    Patient here for annual physical / wellness exam.  See preventive care reviewed as below.   Additional concerns today include:  None Occasional sinus issues Refill on propranolol needed for stress prn  Shoulder pain    Past medical, surgical, social and family history reviewed:  Patient Active Problem List   Diagnosis Date Noted  . Leukopenia 04/21/2016  . Palpitations 04/21/2016  . Anxiety state 04/21/2016  . Rash and nonspecific skin eruption 02/17/2016  . Irregular periods 02/17/2016    Past Surgical History:  Procedure Laterality Date  . CESAREAN SECTION      Social History   Tobacco Use  . Smoking status: Never Smoker  . Smokeless tobacco: Never Used  Substance Use Topics  . Alcohol use: No    Alcohol/week: 0.0 standard drinks    Family History  Problem Relation Age of Onset  . Hypertension Mother   . Hypertension Father   . Rheum arthritis Father   . Hepatitis C Father   . Lupus Father      Current medication list and allergy/intolerance information reviewed:    Current Outpatient Medications  Medication Sig Dispense Refill  . ipratropium (ATROVENT) 0.06 % nasal spray Place 2 sprays into both nostrils 4 (four) times daily. (Patient not taking: Reported on 05/09/2018) 15 mL 1   No current facility-administered medications for this visit.     Allergies  Allergen Reactions  . Sulfa Antibiotics Hives      Review of Systems:  Constitutional:  No  fever, no chills, No recent illness, No unintentional weight changes. No significant fatigue.   HEENT: No  headache, no vision change, no hearing change, No sore throat, No  sinus pressure  Cardiac: No  chest pain, No  pressure, No palpitations, No  Orthopnea  Respiratory:  No  shortness of  breath. No  Cough  Gastrointestinal: No  abdominal pain, No  nausea, No  vomiting,  No  blood in stool, No  diarrhea, No  constipation   Musculoskeletal: +L shoulder myalgia/arthralgia  Skin: No  Rash, No other wounds/concerning lesions  Genitourinary: No  incontinence, No  abnormal genital bleeding, No abnormal genital discharge  Hem/Onc: No  easy bruising/bleeding, No  abnormal lymph node  Endocrine: No cold intolerance,  No heat intolerance. No polyuria/polydipsia/polyphagia   Neurologic: No  weakness, No  dizziness, No  slurred speech/focal weakness/facial droop  Psychiatric: No  concerns with depression, No  concerns with anxiety, No sleep problems, No mood problems  Exam:  BP 123/75 (BP Location: Left Arm, Patient Position: Sitting, Cuff Size: Normal)   Pulse 88   Temp 98.2 F (36.8 C) (Oral)   Wt 130 lb 8 oz (59.2 kg)   BMI 23.87 kg/m   Constitutional: VS see above. General Appearance: alert, well-developed, well-nourished, NAD  Eyes: Normal lids and conjunctive, non-icteric sclera  Ears, Nose, Mouth, Throat: MMM, Normal external inspection ears/nares/mouth/lips/gums. TM normal bilaterally. Pharynx/tonsils no erythema, no exudate. Nasal mucosa normal.   Neck: No masses, trachea midline. No thyroid enlargement. No tenderness/mass appreciated. No lymphadenopathy  Respiratory: Normal respiratory effort. no wheeze, no rhonchi, no rales  Cardiovascular: S1/S2 normal, no murmur, no rub/gallop auscultated. RRR. No lower extremity edema. Pedal pulse II/IV bilaterally DP and PT. No carotid bruit or JVD. No abdominal  aortic bruit.  Gastrointestinal: Nontender, no masses. No hepatomegaly, no splenomegaly. No hernia appreciated. Bowel sounds normal. Rectal exam deferred.   Musculoskeletal: Gait normal. No clubbing/cyanosis of digits.Limited ROM abduction shoulder    Neurological: Normal balance/coordination. No tremor. No cranial nerve deficit on limited exam. Motor and  sensation intact and symmetric. Cerebellar reflexes intact.   Skin: warm, dry, intact. No rash/ulcer. No concerning nevi or subq nodules on limited exam.    Psychiatric: Normal judgment/insight. Normal mood and affect. Oriented x3.      ASSESSMENT/PLAN:   Annual physical exam - Plan: CBC, COMPLETE METABOLIC PANEL WITH GFR, Lipid panel, TSH, VITAMIN D 25 Hydroxy (Vit-D Deficiency, Fractures)  Chronic left shoulder pain - Plan: DG Shoulder Left    Patient Instructions  General Preventive Care  Most recent routine screening lipids/other labs: ordered today   Tobacco: don't! Alcohol: moderation is ok for most people. Recreational/Illicit Drugs: don't!  Exercise: as tolerated to reduce risk of cardiovascular disease and diabetes  Mental health: if need for mental health care (medicines, counseling, other), or concerns about moods, please let me know!   Sexual health: if need for pregnancy prevention or STD testing, please let me know!   Vaccines  Flu vaccine: recommended every fall (by Halloween!)  Shingles vaccine: Shingrix recommended after age 64  Pneumonia vaccines: Prevnar and Pneumovax recommended after age 72, sooner if diabetes, COPD/asthma, others  Tetanus booster: Tdap recommended every 10 years  Cancer screenings   Colon cancer screening: recommended at age 67  Breast cancer screening: mammogram recommended at age 78   Cervical cancer screening: every 1 to 5 years depending on age and other risk factors. - normal - continue follow up with OBGYN  Infection screenings . HIV: recommended screening at least once age 30-65, more often if risk factors  . Gonorrhea/Chlamydia: screening as needed . Hepatitis C: recommended for anyone born 29-1964  Other . Bone Density Test: recommended for women at age 42 . Advanced Directive: Living Will and/or Healthcare Power of Attorney recommended for everyone, regardless of age or health . Cholesterol & Diabetes:  recommended screening annually . Thyroid and Vitamin D: routine screening not medically necessary, most insurance may not cover this testing unless needed   Misc:  Try Flonase + Claritin/Allegra/Zyrtec for allergies/sinus  Copy kept for your paperwork, original was returned to you to hang on to in case needed. Will complete paperwork and send it when we have lab results back!     Visit summary with medication list and pertinent instructions was printed for patient to review. All questions at time of visit were answered - patient instructed to contact office with any additional concerns. ER/RTC precautions were reviewed with the patient.   Follow-up plan: Return in about 1 year (around 05/10/2019) for [place], sooner if needed .    Please note: voice recognition software was used to produce this document, and typos may escape review. Please contact Dr. Sheppard Coil for any needed clarifications.

## 2018-05-09 NOTE — Patient Instructions (Addendum)
General Preventive Care  Most recent routine screening lipids/other labs: ordered today   Tobacco: don't! Alcohol: moderation is ok for most people. Recreational/Illicit Drugs: don't!  Exercise: as tolerated to reduce risk of cardiovascular disease and diabetes  Mental health: if need for mental health care (medicines, counseling, other), or concerns about moods, please let me know!   Sexual health: if need for pregnancy prevention or STD testing, please let me know!   Vaccines  Flu vaccine: recommended every fall (by Halloween!)  Shingles vaccine: Shingrix recommended after age 54  Pneumonia vaccines: Prevnar and Pneumovax recommended after age 58, sooner if diabetes, COPD/asthma, others  Tetanus booster: Tdap recommended every 10 years  Cancer screenings   Colon cancer screening: recommended at age 33  Breast cancer screening: mammogram recommended at age 76   Cervical cancer screening: every 1 to 5 years depending on age and other risk factors. - normal - continue follow up with OBGYN  Infection screenings . HIV: recommended screening at least once age 76-65, more often if risk factors  . Gonorrhea/Chlamydia: screening as needed . Hepatitis C: recommended for anyone born [birth year]-1965  Other . Bone Density Test: recommended for women at age 70 . Advanced Directive: Living Will and/or Healthcare Power of Attorney recommended for everyone, regardless of age or health . Cholesterol & Diabetes: recommended screening annually . Thyroid and Vitamin D: routine screening not medically necessary, most insurance may not cover this testing unless needed   Misc:  Try Flonase + Claritin/Allegra/Zyrtec for allergies/sinus  Copy kept for your paperwork, original was returned to you to hang on to in case needed. Will complete paperwork and send it when we have lab results back!

## 2018-05-10 LAB — COMPLETE METABOLIC PANEL WITH GFR
AG RATIO: 1.4 (calc) (ref 1.0–2.5)
ALKALINE PHOSPHATASE (APISO): 47 U/L (ref 33–115)
ALT: 13 U/L (ref 6–29)
AST: 17 U/L (ref 10–35)
Albumin: 4.5 g/dL (ref 3.6–5.1)
BUN: 17 mg/dL (ref 7–25)
CALCIUM: 9.9 mg/dL (ref 8.6–10.2)
CHLORIDE: 101 mmol/L (ref 98–110)
CO2: 30 mmol/L (ref 20–32)
Creat: 0.95 mg/dL (ref 0.50–1.10)
GFR, EST NON AFRICAN AMERICAN: 72 mL/min/{1.73_m2} (ref >=60)
GFR, Est African American: 83 mL/min/{1.73_m2} (ref >=60)
GLOBULIN: 3.3 g/dL (ref 1.9–3.7)
Glucose, Bld: 76 mg/dL (ref 65–139)
POTASSIUM: 3.9 mmol/L (ref 3.5–5.3)
Sodium: 137 mmol/L (ref 135–146)
Total Bilirubin: 0.4 mg/dL (ref 0.2–1.2)
Total Protein: 7.8 g/dL (ref 6.1–8.1)

## 2018-05-10 LAB — LIPID PANEL
CHOLESTEROL: 170 mg/dL (ref <200)
HDL: 61 mg/dL (ref >50)
LDL Cholesterol (Calc): 84 mg/dL (calc)
NON-HDL CHOLESTEROL (CALC): 109 mg/dL (ref <130)
Total CHOL/HDL Ratio: 2.8 (calc) (ref <5.0)
Triglycerides: 146 mg/dL (ref <150)

## 2018-05-10 LAB — CBC
HCT: 39.3 % (ref 35.0–45.0)
Hemoglobin: 12.7 g/dL (ref 11.7–15.5)
MCH: 27.8 pg (ref 27.0–33.0)
MCHC: 32.3 g/dL (ref 32.0–36.0)
MCV: 86 fL (ref 80.0–100.0)
MPV: 10.7 fL (ref 7.5–12.5)
PLATELETS: 256 10*3/uL (ref 140–400)
RBC: 4.57 10*6/uL (ref 3.80–5.10)
RDW: 13.5 % (ref 11.0–15.0)
WBC: 3.1 10*3/uL — AB (ref 3.8–10.8)

## 2018-05-10 LAB — VITAMIN D 25 HYDROXY (VIT D DEFICIENCY, FRACTURES): Vit D, 25-Hydroxy: 36 ng/mL (ref 30–100)

## 2018-05-10 LAB — TSH: TSH: 1.14 mIU/L

## 2018-05-10 NOTE — Progress Notes (Signed)
Pt has seen results on MyChart and message also sent for patient to call back if any questions.

## 2018-07-30 ENCOUNTER — Telehealth: Payer: Self-pay | Admitting: Osteopathic Medicine

## 2018-07-30 NOTE — Telephone Encounter (Signed)
Our records indicate that patient is not up to date on annual physical or Pap / Mammogram though we may be missing records.   Can we call her to see where she had last Pap/Mammo and if we can get records?  Also, completed her FMLA, should be up front waiting for her to pick up

## 2018-07-31 NOTE — Telephone Encounter (Signed)
Pt advised, she will get FMLA forms.  She had Pap/Mammo done in Jan 2019 at [place]. Records requested, chart updated.

## 2018-08-23 ENCOUNTER — Encounter: Payer: Self-pay | Admitting: Osteopathic Medicine

## 2018-09-13 ENCOUNTER — Encounter: Payer: Self-pay | Admitting: Osteopathic Medicine

## 2018-10-25 ENCOUNTER — Encounter: Payer: Self-pay | Admitting: Osteopathic Medicine

## 2019-05-16 ENCOUNTER — Ambulatory Visit: Payer: BC Managed Care – PPO | Admitting: Family Medicine

## 2019-05-16 ENCOUNTER — Other Ambulatory Visit: Payer: Self-pay

## 2019-05-16 VITALS — BP 149/81 | HR 78 | Temp 98.3°F | Wt 136.0 lb

## 2019-05-16 DIAGNOSIS — Z23 Encounter for immunization: Secondary | ICD-10-CM | POA: Diagnosis not present

## 2019-05-16 DIAGNOSIS — M25561 Pain in right knee: Secondary | ICD-10-CM

## 2019-05-16 DIAGNOSIS — S39012A Strain of muscle, fascia and tendon of lower back, initial encounter: Secondary | ICD-10-CM | POA: Diagnosis not present

## 2019-05-16 MED ORDER — CYCLOBENZAPRINE HCL 10 MG PO TABS: 10.0000 mg | ORAL_TABLET | Freq: Three times a day (TID) | ORAL | 0 refills | Status: DC | PRN

## 2019-05-16 MED ORDER — DICLOFENAC SODIUM 1 % TD GEL: 4.0000 g | Freq: Four times a day (QID) | TRANSDERMAL | 11 refills | Status: DC

## 2019-05-16 NOTE — Patient Instructions (Signed)
Thank you for coming in today. Attend PT in [place] if not getting better.  Ok to use muscle relaxer at bedtime as needed.  Use diclofenac gel on the knee as needed.  Consider heating pad and TENS unit.   TENS UNIT: This is helpful for muscle pain and spasm.   Search and Purchase a TENS 7000 2nd edition at  www.tenspros.com or www.[place].[email or web address] It should be less than $30.     TENS unit instructions: Do not shower or bathe with the unit on Turn the unit off before removing electrodes or batteries If the electrodes lose stickiness add a drop of water to the electrodes after they are disconnected from the unit and place on plastic sheet. If you continued to have difficulty, call the TENS unit company to purchase more electrodes. Do not apply lotion on the skin area prior to use. Make sure the skin is clean and dry as this will help prolong the life of the electrodes. After use, always check skin for unusual red areas, rash or other skin difficulties. If there are any skin problems, does not apply electrodes to the same area. Never remove the electrodes from the unit by pulling the wires. Do not use the TENS unit or electrodes other than as directed. Do not change electrode placement without consultating your therapist or physician. Keep 2 fingers with between each electrode. Wear time ratio is 2:1, on to off times.    For example on for 30 minutes off for 15 minutes and then on for 30 minutes off for 15 minutes     Lumbosacral Strain Lumbosacral strain is an injury that causes pain in the lower back (lumbosacral spine). This injury usually occurs from overstretching the muscles or ligaments along your spine. A strain can affect one or more muscles or cord-like tissues that connect bones to other bones (ligaments). What are the causes? This condition may be caused by:  A hard, direct hit (blow) to the back.  Excessive stretching of the lower back muscles. This may result from: ? A fall.  ? Lifting something heavy. ? Repetitive movements such as bending or crouching. What increases the risk? The following factors may increase your risk of getting this condition:  Participating in sports or activities that involve: ? A sudden twist of the back. ? Pushing or pulling motions.  Being overweight or obese.  Having poor strength and flexibility, especially tight hamstrings or weak muscles in the back or abdomen.  Having too much of a curve in the lower back.  Having a pelvis that is tilted forward. What are the signs or symptoms? The main symptom of this condition is pain in the lower back, at the site of the strain. Pain may extend (radiate) down one or both legs. How is this diagnosed? This condition is diagnosed based on:  Your symptoms.  Your medical history.  A physical exam. ? Your health care provider may push on certain areas of your back to determine the source of your pain. ? You may be asked to bend forward, backward, and side to side to assess the severity of your pain and your range of motion.  Imaging tests, such as: ? X-rays. ? MRI.  How is this treated? Treatment for this condition may include:  Putting heat and cold on the affected area.  Medicines to help relieve pain and relax your muscles (muscle relaxants).  NSAIDs to help reduce swelling and discomfort. When your symptoms improve, it is important to gradually return  to your normal routine as soon as possible to reduce pain, avoid stiffness, and avoid loss of muscle strength. Generally, symptoms should improve within 6 weeks of treatment. However, recovery time varies. Follow these instructions at home: Managing pain, stiffness, and swelling   If directed, put ice on the injured area during the first 24 hours after your strain. ? Put ice in a plastic bag. ? Place a towel between your skin and the bag. ? Leave the ice on for 20 minutes, 2-3 times a day.  If directed, put heat on the  affected area as often as told by your health care provider. Use the heat source that your health care provider recommends, such as a moist heat pack or a heating pad. ? Place a towel between your skin and the heat source. ? Leave the heat on for 20-30 minutes. ? Remove the heat if your skin turns bright red. This is especially important if you are unable to feel pain, heat, or cold. You may have a greater risk of getting burned. Activity  Rest and return to your normal activities as told by your health care provider. Ask your health care provider what activities are safe for you.  Avoid activities that take a lot of energy for as long as told by your health care provider. General instructions  Take over-the-counter and prescription medicines only as told by your health care provider.  Donot drive or use heavy machinery while taking prescription pain medicine.  Do not use any products that contain nicotine or tobacco, such as cigarettes and e-cigarettes. If you need help quitting, ask your health care provider.  Keep all follow-up visits as told by your health care provider. This is important. How is this prevented?  Use correct form when playing sports and lifting heavy objects.  Use good posture when sitting and standing.  Maintain a healthy weight.  Sleep on a mattress with medium firmness to support your back.  Be safe and responsible while being active to avoid falls.  Do at least 150 minutes of moderate-intensity exercise each week, such as brisk walking or water aerobics. Try a form of exercise that takes stress off your back, such as swimming or stationary cycling.  Maintain physical fitness, including: ? Strength. ? Flexibility. ? Cardiovascular fitness. ? Endurance. Contact a health care provider if:  Your back pain does not improve after 6 weeks of treatment.  Your symptoms get worse. Get help right away if:  Your back pain is severe.  You cannot stand or  walk.  You have difficulty controlling when you urinate or when you have a bowel movement.  You feel nauseous or you vomit.  Your feet get very cold.  You have numbness, tingling, weakness, or problems using your arms or legs.  You develop any of the following: ? Shortness of breath. ? Dizziness. ? Pain in your legs. ? Weakness in your buttocks or legs. ? Discoloration of the skin on your toes or legs. This information is not intended to replace advice given to you by your health care provider. Make sure you discuss any questions you have with your health care provider. Document Released: 06/01/2005 Document Revised: 12/14/2018 Document Reviewed: 01/24/2016 Elsevier Patient Education  2020 [employer].

## 2019-05-16 NOTE — Progress Notes (Signed)
Krista Nunez is a 48 y.o. female who presents to [place] today for low back pain.  Patient developed right low back pain starting about 3 weeks ago.  She was pulling her dog and her torso twisted.  She was doing okay with moderate pain until about 2 days ago when she developed much worse left low back pain.  She denies any radiating pain weakness or numbness distally.  She denies any bowel bladder dysfunction.  She notes the pain is worse with activity and somewhat better with rest.  She is done a lot of home exercises which have helped some.  Additionally she notes that her knee is a bit sore as well.  Her dog ran into the posterior aspect of her right knee.  She notes a little sore at the lateral aspect of the knee.  She denies locking or catching or giving way.  She notes that she has moved to [place] but is here temporarily.  She will be moving back to [place] in the next few days.     ROS:  As above  Exam:  BP (!) 149/81   Pulse 78   Temp 98.3 F (36.8 C) (Oral)   Wt 136 lb (61.7 kg)   SpO2 100%   BMI 24.87 kg/m  Wt Readings from Last 5 Encounters:  05/16/19 136 lb (61.7 kg)  05/09/18 130 lb 8 oz (59.2 kg)  09/06/17 132 lb (59.9 kg)  08/24/17 127 lb 1.3 oz (57.6 kg)  06/22/17 130 lb (59 kg)   General: Well Developed, well nourished, and in no acute distress.  Neuro/Psych: Alert and oriented x3, extra-ocular muscles intact, able to move all 4 extremities, sensation grossly intact. Skin: Warm and dry, no rashes noted.  Respiratory: Not using accessory muscles, speaking in full sentences, trachea midline.  Cardiovascular: Pulses palpable, no extremity edema. Abdomen: Does not appear distended. MSK: L-spine: Nontender to spinal midline.  Tender palpation left lumbar paraspinal musculature. Normal lumbar motion some pain with extension of flexion. Lower extremity strength reflexes and sensation are equal normal throughout.  Negative slump test.  Right knee: Normal-appearing.  Not particularly tender. Normal range of motion. Stable ligaments exam. Normal strength.  Negative varus test.      Assessment and Plan: 48 y.o. female with  Lumbosacral strain.  Pain is likely myofascial and muscular.  Plan for trial of physical therapy in [place].  Additionally will use over-the-counter prescription strength NSAIDs as well as cyclobenzaprine.  Use heating pad and TENS unit.  If not improving seek further care in [place].  Knee pain: Possible IT band.  Significant ligamentous injury is less likely given her relatively normal exam today.  Discussed options.  Plan for trial of diclofenac gel and watchful waiting.  If not improving will likely benefit from x-ray and further evaluation management.  Flu vaccine given today prior to discharge.   PDMP not reviewed this encounter. Orders Placed This Encounter  Procedures  . Flu Vaccine QUAD 6+ mos PF IM (Fluarix Quad PF)  . Ambulatory referral to Physical Therapy    Referral Priority:   Routine    Referral Type:   Physical Medicine    Referral Reason:   Specialty Services Required    Requested Specialty:   Physical Therapy    Number of Visits Requested:   1   Meds ordered this encounter  Medications  . diclofenac sodium (VOLTAREN) 1 % GEL    Sig: Apply 4 g topically 4 (  four) times daily. To affected joint.    Dispense:  100 g    Refill:  11  . cyclobenzaprine (FLEXERIL) 10 MG tablet    Sig: Take 1 tablet (10 mg total) by mouth 3 (three) times daily as needed for muscle spasms.    Dispense:  30 tablet    Refill:  0    Historical information moved to improve visibility of documentation.  No past medical history on file. Past Surgical History:  Procedure Laterality Date  . CESAREAN SECTION     Social History   Tobacco Use  . Smoking status: Never Smoker  . Smokeless tobacco: Never Used  Substance Use Topics  . Alcohol use: No    Alcohol/week: 0.0 standard  drinks   family history includes Hepatitis C in her father; Hypertension in her father and mother; Lung disease in her brother; Lupus in her father; Rheum arthritis in her father.  Medications: Current Outpatient Medications  Medication Sig Dispense Refill  . diazepam (VALIUM) 5 MG tablet diazepam 5 mg tablet    . FERROUS SULFATE PO Take by mouth.    Marland Kitchen HYDROcodone-acetaminophen (NORCO/VICODIN) 5-325 MG tablet hydrocodone 5 mg-acetaminophen 325 mg tablet    . hydrOXYzine (ATARAX/VISTARIL) 50 MG tablet hydroxyzine HCl 50 mg tablet    . ibuprofen (ADVIL,MOTRIN) 200 MG tablet Take by mouth.    Marland Kitchen ipratropium (ATROVENT) 0.06 % nasal spray ipratropium bromide 42 mcg (0.06 %) nasal spray    . medroxyPROGESTERone (PROVERA) 5 MG tablet medroxyprogesterone 5 mg tablet    . meloxicam (MOBIC) 15 MG tablet meloxicam 15 mg tablet    . propranolol (INDERAL) 20 MG tablet Take 1 tablet (20 mg total) by mouth 3 (three) times daily as needed (anxiety/palpitations). 30 tablet 0  . amoxicillin (AMOXIL) 875 MG tablet amoxicillin 875 mg tablet    . benzonatate (TESSALON) 200 MG capsule benzonatate 200 mg capsule    . cyclobenzaprine (FLEXERIL) 10 MG tablet Take 1 tablet (10 mg total) by mouth 3 (three) times daily as needed for muscle spasms. 30 tablet 0  . diclofenac sodium (VOLTAREN) 1 % GEL Apply 4 g topically 4 (four) times daily. To affected joint. 100 g 11  . doxycycline (VIBRAMYCIN) 100 MG capsule doxycycline hyclate 100 mg capsule    . fluconazole (DIFLUCAN) 150 MG tablet fluconazole 150 mg tablet    . Inositol-Folic Acid (PREGNITUDE) 2000-200 MG-MCG PACK Pregnitude 200 mcg-2,000 mg oral powder packet  Take 2 packets every day by oral route.    . methylPREDNISolone (MEDROL DOSEPAK) 4 MG TBPK tablet methylprednisolone 4 mg tablets in a dose pack     No current facility-administered medications for this visit.    Allergies  Allergen Reactions  . Sulfa Antibiotics Hives      Discussed warning signs  or symptoms. Please see discharge instructions. Patient expresses understanding.

## 2019-06-15 IMAGING — DX DG HIP (WITH OR WITHOUT PELVIS) 2-3V*R*
3 series · 3 of 3 positions shown · non-contrast
Comparison: None.

CLINICAL DATA: Right hip pain for 3 months.  No injury.

EXAM:
DG HIP (WITH OR WITHOUT PELVIS) 2-3V RIGHT

[hip ap]
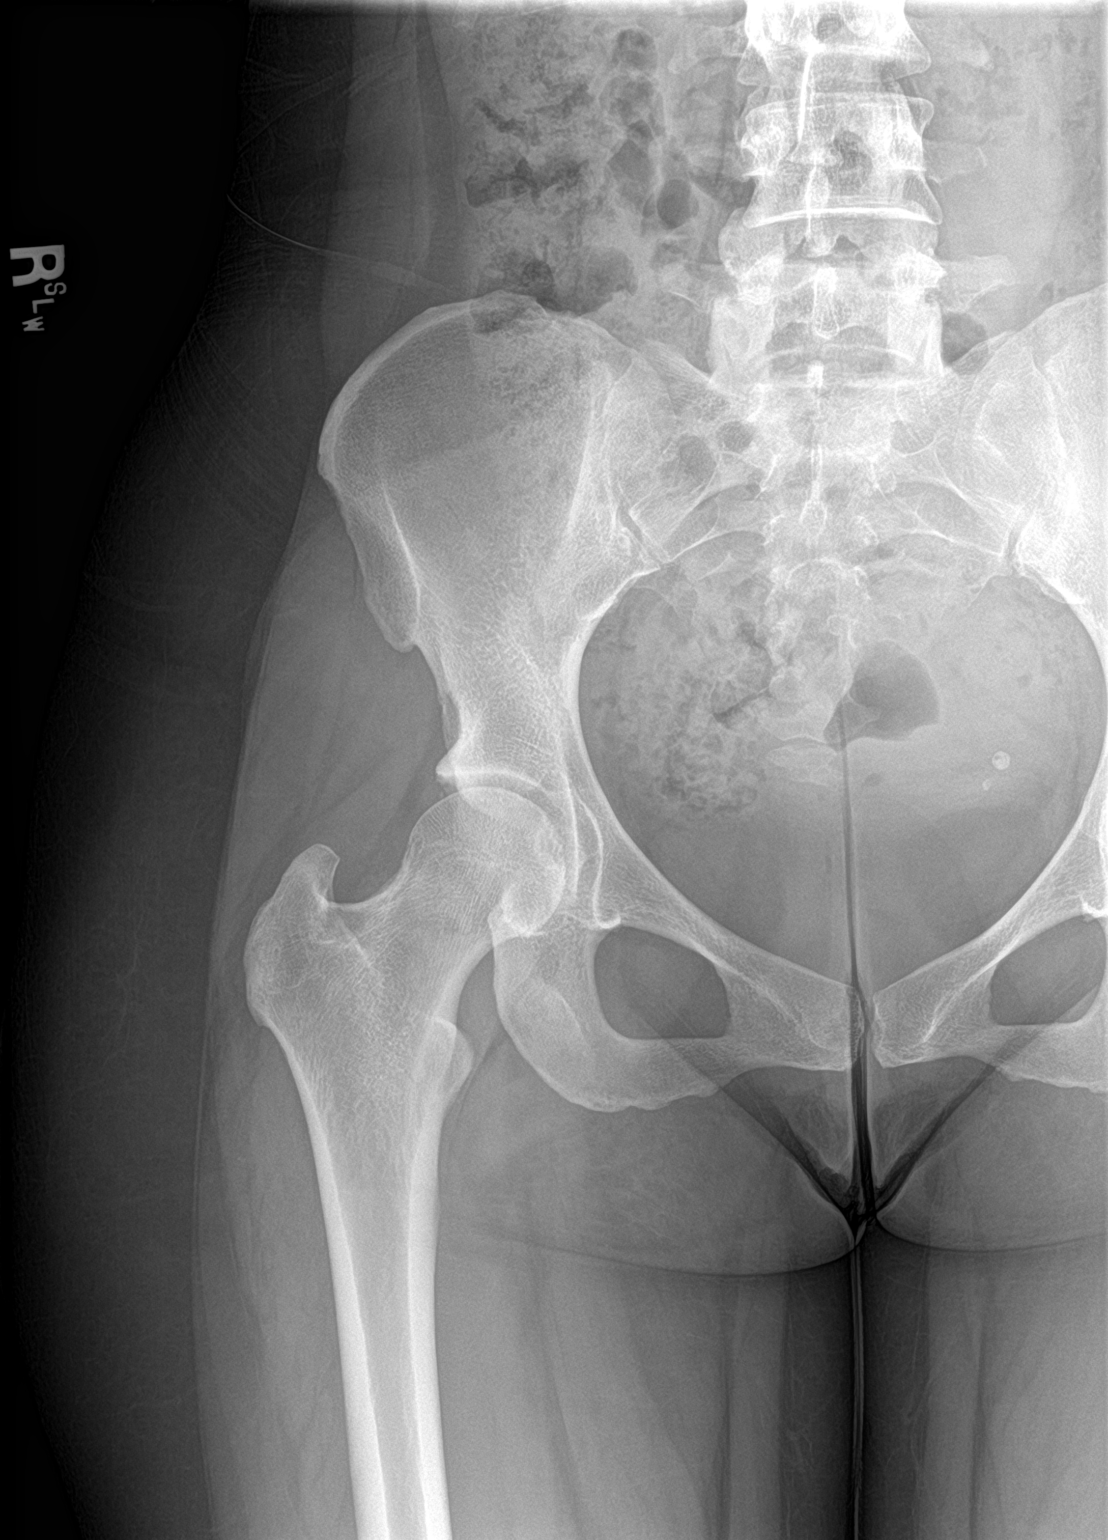

[pelvis ap]
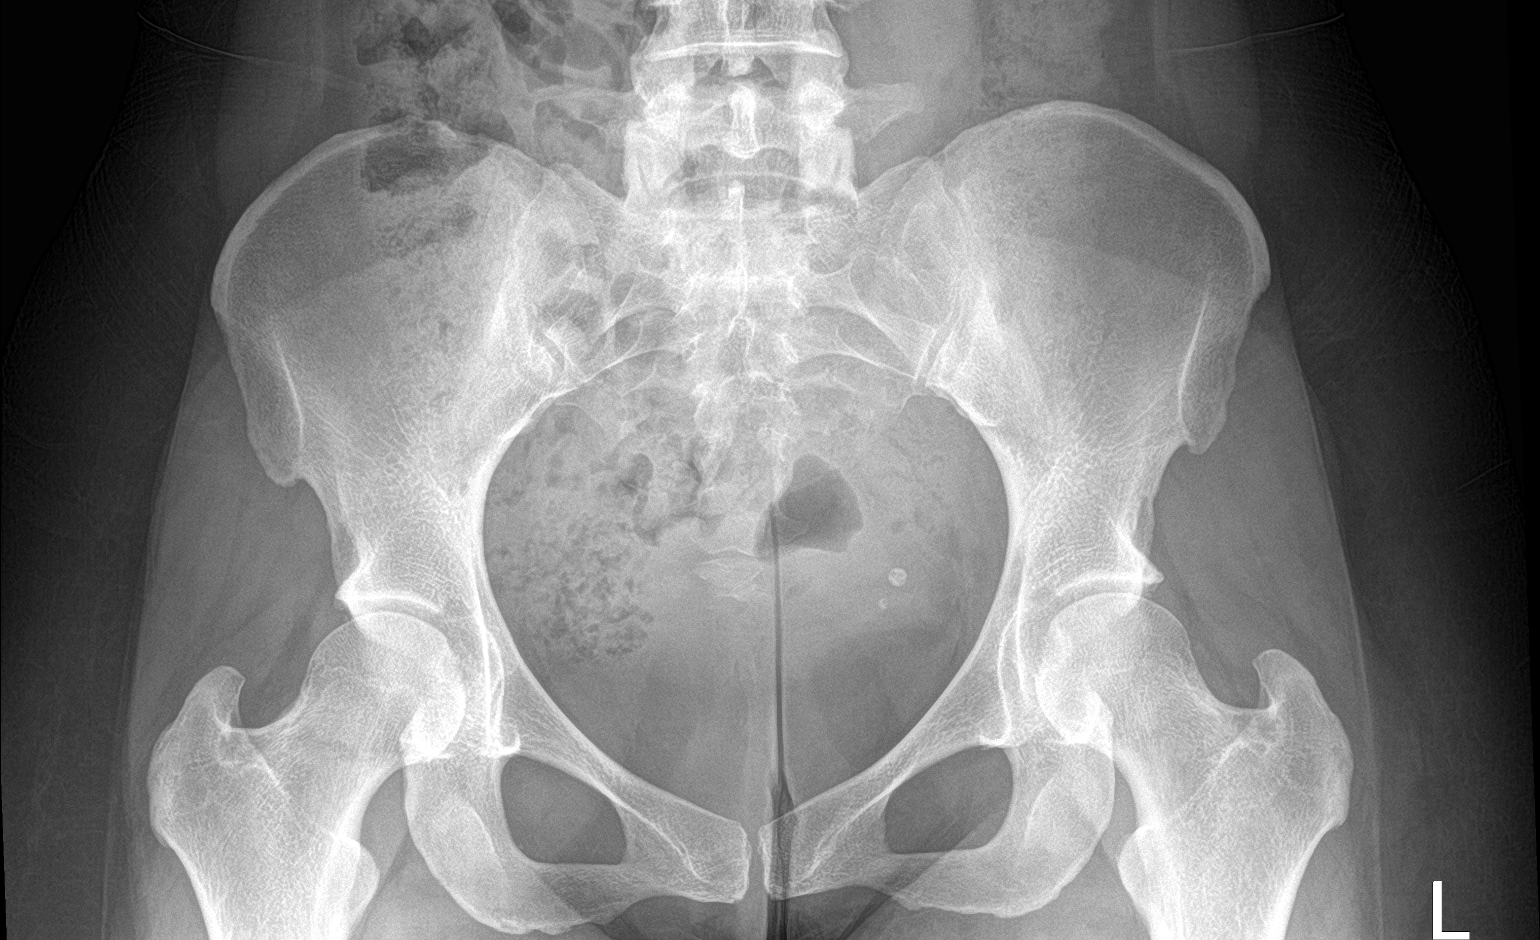

[hip lat]
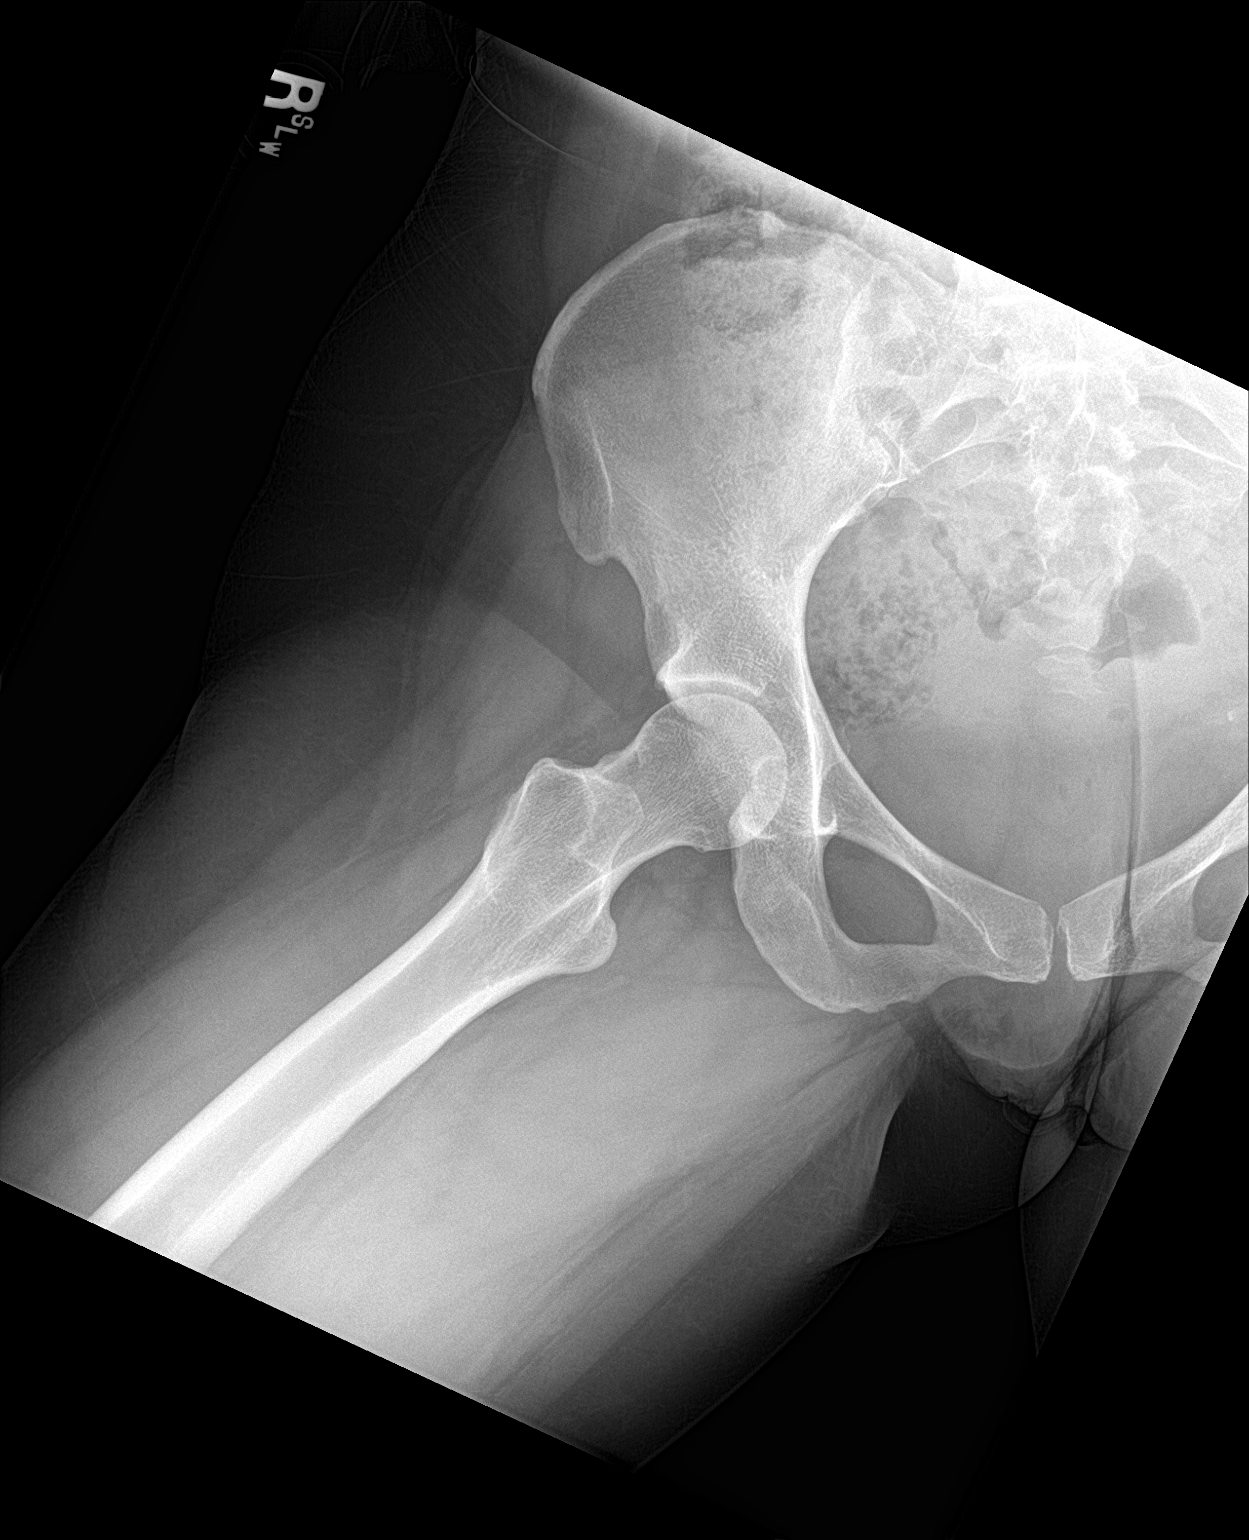

[3 of 3 positions shown; findings below may reference images not displayed]

FINDINGS: There is no evidence of hip fracture or dislocation. There is no
evidence of arthropathy or other focal bone abnormality.
IMPRESSION: Negative.

## 2022-01-03 NOTE — Progress Notes (Deleted)
    Subjective:    CC: Low back pain w/ R leg pain  I, Jaylani Mcguinn, LAT, ATC, am serving as scribe for Dr. Lynne Leader.  HPI: Pt is a 51 y/o female presenting w/ c/o R-sided low back pain and R leg pain x .  She locates her pain to .  She was previously seen by Dr. Georgina Snell at [facility] on 05/16/19 for L-sided LBP.  Radiating pain: R LE paresthesias: Aggravating factors: Treatments tried:   Pertinent review of Systems: ***  Relevant historical information: ***   Objective:   There were no vitals filed for this visit. General: Well Developed, well nourished, and in no acute distress.   MSK: ***  Lab and Radiology Results No results found for this or any previous visit (from the past 72 hour(s)). No results found.    Impression and Recommendations:    Assessment and Plan: 51 y.o. female with ***.  PDMP not reviewed this encounter. No orders of the defined types were placed in this encounter.  No orders of the defined types were placed in this encounter.   Discussed warning signs or symptoms. Please see discharge instructions. Patient expresses understanding.   ***

## 2022-01-04 ENCOUNTER — Ambulatory Visit: Payer: BC Managed Care – PPO | Admitting: Family Medicine

## 2022-07-08 DIAGNOSIS — Z1231 Encounter for screening mammogram for malignant neoplasm of breast: Secondary | ICD-10-CM | POA: Diagnosis not present

## 2022-07-14 LAB — HM MAMMOGRAPHY

## 2022-09-30 ENCOUNTER — Encounter: Payer: Self-pay | Admitting: Podiatry

## 2022-09-30 ENCOUNTER — Ambulatory Visit: Payer: Federal, State, Local not specified - PPO | Admitting: Podiatry

## 2022-09-30 DIAGNOSIS — M205X1 Other deformities of toe(s) (acquired), right foot: Secondary | ICD-10-CM | POA: Diagnosis not present

## 2022-09-30 DIAGNOSIS — M21611 Bunion of right foot: Secondary | ICD-10-CM

## 2022-09-30 MED ORDER — MELOXICAM 15 MG PO TABS: 15.0000 mg | ORAL_TABLET | Freq: Every day | ORAL | 0 refills | Status: AC

## 2022-09-30 MED ORDER — METHYLPREDNISOLONE 4 MG PO TBPK: ORAL_TABLET | ORAL | 0 refills | Status: DC

## 2022-09-30 NOTE — Progress Notes (Signed)
  Subjective:  Patient ID: Krista Nunez, female    DOB: [birth year],   MRN: [id number]  Chief Complaint  Patient presents with   [place] on right foot (bunion). Patient has had an injection and she had relief. Last injection was about 2 years ago.     52 y.o. female presents for concern of right foot bunion that she has been dealing with for years. Relates in [place] she was seen by a podiatrist who had given her a couple injections that had helped. Relates she likes to stay active and has noticed it has been more painful. States narrower shoes hurt it more. Denies any current treatmetns . Denies any other pedal complaints. Denies n/v/f/c.   No past medical history on file.  Objective:  Physical Exam: Vascular: DP/PT pulses 2/4 bilateral. CFT <3 seconds. Normal hair growth on digits. No edema.  Skin. No lacerations or abrasions bilateral feet.  Musculoskeletal: MMT 5/5 bilateral lower extremities in DF, PF, Inversion and Eversion. Deceased ROM in DF of ankle joint.  Decreased Rom of the first MPJ. Tender to dorsum of first Mpj and on medial eminence. Mild HAV deformity noted.  Neurological: Sensation intact to light touch.   Assessment:   1. Bunion, right   2. Hallux limitus of right foot      Plan:  Patient was evaluated and treated and all questions answered. -Xrays reviewed not taken today. Will attempt to get them at next visit if needed.  -Discussed HAV and hallux limitus and treatment options;conservative and surgical management; risks, benefits, alternatives discussed. All patient's questions answered. -Discussed padding and wide shoe gear.   -Recommend continue with good supportive shoes and inserts.  -Discussed surgical options.  -Medrol dose pack and meloxicam provided for inflammation and deferred injection today.  -Patient to return to office as needed or sooner if condition worsens.   Lorenda Peck, DPM

## 2022-10-10 ENCOUNTER — Ambulatory Visit: Payer: Federal, State, Local not specified - PPO | Admitting: Family Medicine

## 2022-10-10 ENCOUNTER — Encounter: Payer: Self-pay | Admitting: Family Medicine

## 2022-10-10 VITALS — BP 127/85 | HR 103 | Temp 98.2°F | Ht 62.0 in | Wt 138.0 lb

## 2022-10-10 DIAGNOSIS — Z136 Encounter for screening for cardiovascular disorders: Secondary | ICD-10-CM

## 2022-10-10 DIAGNOSIS — Z1330 Encounter for screening examination for mental health and behavioral disorders, unspecified: Secondary | ICD-10-CM | POA: Diagnosis not present

## 2022-10-10 DIAGNOSIS — F411 Generalized anxiety disorder: Secondary | ICD-10-CM | POA: Diagnosis not present

## 2022-10-10 DIAGNOSIS — Z780 Asymptomatic menopausal state: Secondary | ICD-10-CM

## 2022-10-10 DIAGNOSIS — D649 Anemia, unspecified: Secondary | ICD-10-CM | POA: Insufficient documentation

## 2022-10-10 DIAGNOSIS — Z1159 Encounter for screening for other viral diseases: Secondary | ICD-10-CM

## 2022-10-10 DIAGNOSIS — Z13220 Encounter for screening for lipoid disorders: Secondary | ICD-10-CM

## 2022-10-10 DIAGNOSIS — D259 Leiomyoma of uterus, unspecified: Secondary | ICD-10-CM

## 2022-10-10 DIAGNOSIS — Z124 Encounter for screening for malignant neoplasm of cervix: Secondary | ICD-10-CM | POA: Diagnosis not present

## 2022-10-10 DIAGNOSIS — Z1151 Encounter for screening for human papillomavirus (HPV): Secondary | ICD-10-CM | POA: Diagnosis not present

## 2022-10-10 DIAGNOSIS — Z0000 Encounter for general adult medical examination without abnormal findings: Secondary | ICD-10-CM

## 2022-10-10 DIAGNOSIS — R7302 Impaired glucose tolerance (oral): Secondary | ICD-10-CM

## 2022-10-10 DIAGNOSIS — Z7689 Persons encountering health services in other specified circumstances: Secondary | ICD-10-CM

## 2022-10-10 DIAGNOSIS — Z1211 Encounter for screening for malignant neoplasm of colon: Secondary | ICD-10-CM

## 2022-10-10 DIAGNOSIS — N921 Excessive and frequent menstruation with irregular cycle: Secondary | ICD-10-CM

## 2022-10-10 DIAGNOSIS — R030 Elevated blood-pressure reading, without diagnosis of hypertension: Secondary | ICD-10-CM

## 2022-10-10 DIAGNOSIS — E559 Vitamin D deficiency, unspecified: Secondary | ICD-10-CM

## 2022-10-10 DIAGNOSIS — N920 Excessive and frequent menstruation with regular cycle: Secondary | ICD-10-CM | POA: Insufficient documentation

## 2022-10-10 DIAGNOSIS — Z113 Encounter for screening for infections with a predominantly sexual mode of transmission: Secondary | ICD-10-CM | POA: Diagnosis not present

## 2022-10-10 DIAGNOSIS — Z01419 Encounter for gynecological examination (general) (routine) without abnormal findings: Secondary | ICD-10-CM | POA: Diagnosis not present

## 2022-10-10 HISTORY — DX: Excessive and frequent menstruation with irregular cycle: N92.1

## 2022-10-10 HISTORY — DX: Leiomyoma of uterus, unspecified: D25.9

## 2022-10-10 HISTORY — DX: Vitamin D deficiency, unspecified: E55.9

## 2022-10-10 LAB — HM PAP SMEAR: HM Pap smear: NORMAL

## 2022-10-10 LAB — RESULTS CONSOLE HPV: CHL HPV: NEGATIVE

## 2022-10-10 NOTE — Progress Notes (Signed)
Complete physical exam  Patient: Krista Nunez   DOB: 21-Sep-1970   52 y.o. Female  MRN: [id number]  Subjective:    Chief Complaint  Patient presents with   Annual Exam   Establish Care    Would like to discuss menopausal symptoms.    Krista Nunez is a 52 y.o. female who presents today for a complete physical exam. She reports consuming a general diet. Home exercise routine includes yoga. She generally feels well. She reports sleeping fairly well. She does have additional problems to discuss today.  Having worsening menopausal symptoms. Using OTC herbal Femmennesse.  She has appt with Ob/GYN that will do pap. She's had her mammogram this year.  Most recent fall risk assessment:    10/10/2022    8:34 AM  [place] in the past year? 0  Number falls in past yr: 0  Injury with Fall? 0  Risk for fall due to : No Fall Risks  Follow up Falls evaluation completed     Most recent depression screenings:    10/10/2022    8:34 AM 05/09/2018    3:00 PM  PHQ 2/9 Scores  PHQ - 2 Score 1 2  PHQ- 9 Score 3 7    Vision:Within last year and Dental: No current dental problems  Patient Active Problem List   Diagnosis Date Noted   Vitamin D deficiency 10/10/2022   Uterine leiomyoma 10/10/2022   Anemia 10/10/2022   Menometrorrhagia 10/10/2022   Leukopenia 04/21/2016   Anxiety state 04/21/2016   Past Medical History:  Diagnosis Date   Anxiety state 04/21/2016   With autonomic symptoms - palpitations   Leukopenia 04/21/2016   Menometrorrhagia 10/10/2022   Uterine leiomyoma 10/10/2022   Vitamin D deficiency 10/10/2022   Past Surgical History:  Procedure Laterality Date   CESAREAN SECTION     Social History   Tobacco Use   Smoking status: Never   Smokeless tobacco: Never  Vaping Use   Vaping Use: Never used  Substance Use Topics   Alcohol use: No    Alcohol/week: 0.0 standard drinks of alcohol   Drug use: No   Social History   Socioeconomic History    Marital status: Married    Spouse name: Not on file   Number of children: 1   Years of education: Not on file   Highest education level: Not on file  Occupational History   Occupation: [employer]  Tobacco Use   Smoking status: Never   Smokeless tobacco: Never  Vaping Use   Vaping Use: Never used  Substance and Sexual Activity   Alcohol use: No    Alcohol/week: 0.0 standard drinks of alcohol   Drug use: No   Sexual activity: Yes    Partners: Male  Other Topics Concern   Not on file  Social History Narrative   Not on file   Social Determinants of Health   Financial Resource Strain: Not on file  Food Insecurity: Not on file  Transportation Needs: Not on file  Physical Activity: Not on file  Stress: Not on file  Social Connections: Not on file  Intimate Partner Violence: Not on file      Patient Care Team: Leeanne Rio, MD as PCP - General (Family Medicine)   Outpatient Medications Prior to Visit  Medication Sig   Calcium Carb-Cholecalciferol (CALCIUM 1000 + D) 1000-20 MG-MCG TABS    meloxicam (MOBIC) 15 MG tablet Take 1 tablet (15 mg total) by mouth daily.  UNABLE TO FIND Med Name: Femmenessence   [DISCONTINUED] amoxicillin (AMOXIL) 875 MG tablet amoxicillin 875 mg tablet   [DISCONTINUED] benzonatate (TESSALON) 200 MG capsule benzonatate 200 mg capsule   [DISCONTINUED] cyclobenzaprine (FLEXERIL) 10 MG tablet Take 1 tablet (10 mg total) by mouth 3 (three) times daily as needed for muscle spasms.   [DISCONTINUED] diazepam (VALIUM) 5 MG tablet diazepam 5 mg tablet   [DISCONTINUED] diclofenac sodium (VOLTAREN) 1 % GEL Apply 4 g topically 4 (four) times daily. To affected joint.   [DISCONTINUED] doxycycline (VIBRAMYCIN) 100 MG capsule doxycycline hyclate 100 mg capsule   [DISCONTINUED] estradiol (VIVELLE-DOT) 0.0375 MG/24HR Place 1 patch onto the skin 2 (two) times a week. (Patient not taking: Reported on 10/10/2022)   [DISCONTINUED] FERROUS SULFATE PO Take by mouth.    [DISCONTINUED] fluconazole (DIFLUCAN) 150 MG tablet fluconazole 150 mg tablet   [DISCONTINUED] HYDROcodone-acetaminophen (NORCO/VICODIN) 5-325 MG tablet hydrocodone 5 mg-acetaminophen 325 mg tablet   [DISCONTINUED] hydrOXYzine (ATARAX/VISTARIL) 50 MG tablet hydroxyzine HCl 50 mg tablet   [DISCONTINUED] ibuprofen (ADVIL,MOTRIN) 200 MG tablet Take by mouth.   [DISCONTINUED] Inositol-Folic Acid (PREGNITUDE) 2000-200 MG-MCG PACK Pregnitude 200 mcg-2,000 mg oral powder packet  Take 2 packets every day by oral route.   [DISCONTINUED] ipratropium (ATROVENT) 0.06 % nasal spray ipratropium bromide 42 mcg (0.06 %) nasal spray   [DISCONTINUED] medroxyPROGESTERone (PROVERA) 5 MG tablet medroxyprogesterone 5 mg tablet   [DISCONTINUED] methylPREDNISolone (MEDROL DOSEPAK) 4 MG TBPK tablet Take as directed (Patient not taking: Reported on 10/10/2022)   [DISCONTINUED] progesterone (PROMETRIUM) 100 MG capsule Take 1 capsule by mouth at bedtime. (Patient not taking: Reported on 10/10/2022)   [DISCONTINUED] propranolol (INDERAL) 20 MG tablet Take 1 tablet (20 mg total) by mouth 3 (three) times daily as needed (anxiety/palpitations). (Patient not taking: Reported on 10/10/2022)   No facility-administered medications prior to visit.    Review of Systems  Psychiatric/Behavioral:  The patient is nervous/anxious.        Menopausal symptoms   All other systems reviewed and are negative.         Objective:     BP (!) 147/84   Pulse (!) 103   Temp 98.2 F (36.8 C) (Oral)   Ht '5\' 2"'$  (1.575 m)   Wt 138 lb (62.6 kg)   LMP 02/12/2017   SpO2 100%   BMI 25.24 kg/m  BP Readings from Last 3 Encounters:  10/10/22 (!) 147/84  05/16/19 (!) 149/81  05/09/18 123/75      Physical Exam Vitals and nursing note reviewed.  Constitutional:      Appearance: Normal appearance. She is normal weight.  HENT:     Head: Normocephalic and atraumatic.     Right Ear: Tympanic membrane, ear canal and external ear normal.      Left Ear: Tympanic membrane, ear canal and external ear normal.     Nose: Nose normal.     Mouth/Throat:     Mouth: Mucous membranes are moist.     Pharynx: Oropharynx is clear.  Eyes:     Extraocular Movements: Extraocular movements intact.     Conjunctiva/sclera: Conjunctivae normal.     Pupils: Pupils are equal, round, and reactive to light.  Cardiovascular:     Rate and Rhythm: Normal rate and regular rhythm.     Pulses: Normal pulses.     Heart sounds: Normal heart sounds.  Pulmonary:     Effort: Pulmonary effort is normal.     Breath sounds: Normal breath sounds.  Abdominal:  General: Abdomen is flat. Bowel sounds are normal.  Skin:    General: Skin is warm.     Capillary Refill: Capillary refill takes less than 2 seconds.  Neurological:     General: No focal deficit present.     Mental Status: She is alert and oriented to person, place, and time. Mental status is at baseline.  Psychiatric:        Mood and Affect: Mood normal.        Behavior: Behavior normal.        Thought Content: Thought content normal.        Judgment: Judgment normal.      No results found for any visits on 10/10/22. Last CBC Lab Results  Component Value Date   WBC 3.1 (L) 05/09/2018   HGB 12.7 05/09/2018   HCT 39.3 05/09/2018   MCV 86.0 05/09/2018   MCH 27.8 05/09/2018   RDW 13.5 05/09/2018   PLT 256 84/69/6295   Last metabolic panel Lab Results  Component Value Date   GLUCOSE 76 05/09/2018   NA 137 05/09/2018   K 3.9 05/09/2018   CL 101 05/09/2018   CO2 30 05/09/2018   BUN 17 05/09/2018   CREATININE 0.95 05/09/2018   GFRNONAA 72 05/09/2018   CALCIUM 9.9 05/09/2018   PROT 7.8 05/09/2018   ALBUMIN 4.1 04/06/2017   BILITOT 0.4 05/09/2018   ALKPHOS 43 04/06/2017   AST 17 05/09/2018   ALT 13 05/09/2018   Last lipids Lab Results  Component Value Date   CHOL 170 05/09/2018   HDL 61 05/09/2018   LDLCALC 84 05/09/2018   TRIG 146 05/09/2018   CHOLHDL 2.8 05/09/2018   Last  thyroid functions Lab Results  Component Value Date   TSH 1.14 05/09/2018   Last vitamin D Lab Results  Component Value Date   VD25OH 36 05/09/2018        Assessment & Plan:    Routine Health Maintenance and Physical Exam  Immunization History  Administered Date(s) Administered   Influenza,inj,Quad PF,6+ Mos 05/16/2019, 09/24/2022   Td 09/05/1994   Tdap 04/06/2017    Health Maintenance  Topic Date Due   HIV Screening  Never done   Hepatitis C Screening  Never done   COLONOSCOPY (Pts 45-71yr Insurance coverage will need to be confirmed)  Never done   Zoster Vaccines- Shingrix (1 of 2) Never done   PAP SMEAR-Modifier  10/04/2022   MAMMOGRAM  07/14/2024   DTaP/Tdap/Td (3 - Td or Tdap) 04/07/2027   INFLUENZA VACCINE  Completed   HPV VACCINES  Aged Out   COVID-19 Vaccine  Discontinued    Discussed health benefits of physical activity, and encouraged her to engage in regular exercise appropriate for her age and condition.  Problem List Items Addressed This Visit   None Visit Diagnoses     Annual physical exam    -  Primary   Encounter to establish care with new doctor       Encounter for lipid screening for cardiovascular disease       Relevant Orders   Lipid panel   Need for hepatitis C screening test       Relevant Orders   Hepatitis C antibody   Impaired glucose tolerance       Relevant Orders   CBC with Differential/Platelet   Comprehensive metabolic panel   Hemoglobin A1c   Screening for viral disease       Relevant Orders   HIV Antibody (routine testing w rflx)  Screening for colon cancer       Relevant Orders   Ambulatory referral to Gastroenterology   Menopause       Relevant Orders   TSH   T4, free   Elevated blood pressure reading          Return in about 1 year (around 10/11/2023) for Annual Physical. Screening labs including thyroid function. If all normal, may consider starting SSRI for menopausal symptoms which will also help with  anxiety. Pt use to take Propranolol TID prn. Blood pressure elevated initially but recheck was in normal range and at goal. To continue to monitor.      Leeanne Rio, MD

## 2022-10-11 LAB — CBC WITH DIFFERENTIAL/PLATELET
Basophils Absolute: 0 10*3/uL (ref 0.0–0.2)
Basos: 1 %
EOS (ABSOLUTE): 0.1 10*3/uL (ref 0.0–0.4)
Eos: 2 %
Hematocrit: 39.2 % (ref 34.0–46.6)
Hemoglobin: 13 g/dL (ref 11.1–15.9)
Immature Grans (Abs): 0 10*3/uL (ref 0.0–0.1)
Immature Granulocytes: 1 %
Lymphocytes Absolute: 1.3 10*3/uL (ref 0.7–3.1)
Lymphs: 39 %
MCH: 28.1 pg (ref 26.6–33.0)
MCHC: 33.2 g/dL (ref 31.5–35.7)
MCV: 85 fL (ref 79–97)
Monocytes Absolute: 0.4 10*3/uL (ref 0.1–0.9)
Monocytes: 11 %
Neutrophils Absolute: 1.6 10*3/uL (ref 1.4–7.0)
Neutrophils: 46 %
Platelets: 242 10*3/uL (ref 150–450)
RBC: 4.63 x10E6/uL (ref 3.77–5.28)
RDW: 13 % (ref 11.7–15.4)
WBC: 3.4 10*3/uL (ref 3.4–10.8)

## 2022-10-11 LAB — COMPREHENSIVE METABOLIC PANEL
ALT: 19 IU/L (ref 0–32)
AST: 17 IU/L (ref 0–40)
Albumin/Globulin Ratio: 1.7 (ref 1.2–2.2)
Albumin: 4.6 g/dL (ref 3.9–4.9)
Alkaline Phosphatase: 58 IU/L (ref 44–121)
BUN/Creatinine Ratio: 26 — ABNORMAL HIGH (ref 9–23)
BUN: 33 mg/dL — ABNORMAL HIGH (ref 6–24)
Bilirubin Total: 0.2 mg/dL (ref 0.0–1.2)
CO2: 22 mmol/L (ref 20–29)
Calcium: 9.7 mg/dL (ref 8.7–10.2)
Chloride: 101 mmol/L (ref 96–106)
Creatinine, Ser: 1.25 mg/dL — ABNORMAL HIGH (ref 0.57–1.00)
Globulin, Total: 2.7 g/dL (ref 1.5–4.5)
Glucose: 107 mg/dL — ABNORMAL HIGH (ref 70–99)
Potassium: 4.7 mmol/L (ref 3.5–5.2)
Sodium: 141 mmol/L (ref 134–144)
Total Protein: 7.3 g/dL (ref 6.0–8.5)
eGFR: 53 mL/min/{1.73_m2} — ABNORMAL LOW (ref >59)

## 2022-10-11 LAB — LIPID PANEL
Chol/HDL Ratio: 3 ratio (ref 0.0–4.4)
Cholesterol, Total: 204 mg/dL — ABNORMAL HIGH (ref 100–199)
HDL: 67 mg/dL (ref >39)
LDL Chol Calc (NIH): 98 mg/dL (ref 0–99)
Triglycerides: 232 mg/dL — ABNORMAL HIGH (ref 0–149)
VLDL Cholesterol Cal: 39 mg/dL (ref 5–40)

## 2022-10-11 LAB — HEMOGLOBIN A1C
Est. average glucose Bld gHb Est-mCnc: 123 mg/dL
Hgb A1c MFr Bld: 5.9 % — ABNORMAL HIGH (ref 4.8–5.6)

## 2022-10-11 LAB — TSH: TSH: 0.554 u[IU]/mL (ref 0.450–4.500)

## 2022-10-11 LAB — HIV ANTIBODY (ROUTINE TESTING W REFLEX): HIV Screen 4th Generation wRfx: NONREACTIVE

## 2022-10-11 LAB — T4, FREE: Free T4: 1.1 ng/dL (ref 0.82–1.77)

## 2022-10-11 LAB — HEPATITIS C ANTIBODY: Hep C Virus Ab: NONREACTIVE

## 2022-10-31 DIAGNOSIS — Z1211 Encounter for screening for malignant neoplasm of colon: Secondary | ICD-10-CM | POA: Diagnosis not present

## 2022-10-31 DIAGNOSIS — K6389 Other specified diseases of intestine: Secondary | ICD-10-CM | POA: Diagnosis not present

## 2022-10-31 DIAGNOSIS — K648 Other hemorrhoids: Secondary | ICD-10-CM | POA: Diagnosis not present

## 2022-12-07 ENCOUNTER — Encounter: Payer: Self-pay | Admitting: Family Medicine

## 2023-01-13 ENCOUNTER — Encounter: Payer: Self-pay | Admitting: Podiatry

## 2023-01-13 ENCOUNTER — Ambulatory Visit (INDEPENDENT_AMBULATORY_CARE_PROVIDER_SITE_OTHER): Payer: Federal, State, Local not specified - PPO

## 2023-01-13 ENCOUNTER — Ambulatory Visit: Payer: Federal, State, Local not specified - PPO | Admitting: Podiatry

## 2023-01-13 DIAGNOSIS — M205X1 Other deformities of toe(s) (acquired), right foot: Secondary | ICD-10-CM | POA: Diagnosis not present

## 2023-01-13 DIAGNOSIS — M79671 Pain in right foot: Secondary | ICD-10-CM | POA: Diagnosis not present

## 2023-01-13 DIAGNOSIS — M21611 Bunion of right foot: Secondary | ICD-10-CM | POA: Diagnosis not present

## 2023-01-13 DIAGNOSIS — M19071 Primary osteoarthritis, right ankle and foot: Secondary | ICD-10-CM | POA: Diagnosis not present

## 2023-01-13 DIAGNOSIS — M21619 Bunion of unspecified foot: Secondary | ICD-10-CM

## 2023-01-13 MED ORDER — DEXAMETHASONE SODIUM PHOSPHATE 120 MG/30ML IJ SOLN
4.0000 mg | Freq: Once | INTRAMUSCULAR | Status: AC
  Administered 2023-01-13: 4 mg via INTRA_ARTICULAR

## 2023-01-13 NOTE — Progress Notes (Signed)
  Subjective:  Patient ID: Krista Nunez, female    DOB: 1971/04/03,   MRN: [id number]  No chief complaint on file.   52 y.o. female presents for follow-up of right foot bunion and hallux limitus.  . Relates she is still getting pain and unable to take meloxicam. Here fo r injection and to discuss surgical options. Denies any current treatmetns . Denies any other pedal complaints. Denies n/v/f/c.   Past Medical History:  Diagnosis Date   Anxiety state 04/21/2016   With autonomic symptoms - palpitations   Leukopenia 04/21/2016   Menometrorrhagia 10/10/2022   Uterine leiomyoma 10/10/2022   Vitamin D deficiency 10/10/2022    Objective:  Physical Exam: Vascular: DP/PT pulses 2/4 bilateral. CFT <3 seconds. Normal hair growth on digits. No edema.  Skin. No lacerations or abrasions bilateral feet.  Musculoskeletal: MMT 5/5 bilateral lower extremities in DF, PF, Inversion and Eversion. Deceased ROM in DF of ankle joint.  Decreased Rom of the first MPJ. Tender to dorsum of first Mpj and on medial eminence. Mild HAV deformity noted.  Neurological: Sensation intact to light touch.   Assessment:   1. Hallux limitus of right foot   2. Bunion, right      Plan:  Patient was evaluated and treated and all questions answered. -Xrays reviewed not taken today. Will attempt to get them at next visit if needed.  -Discussed HAV and hallux limitus and treatment options;conservative and surgical management; risks, benefits, alternatives discussed. All patient's questions answered. -Discussed padding and wide shoe gear.   -Recommend continue with good supportive shoes and inserts.  -injection offered today procedure below.  -Discussed surgical options. Patient relates she is ready to proceed with surgery and discussed trying minimal treatmnet for now before proceeding with more permanent procedure like MPJ fusion. Discussed cheilectomy and discussed shaving of bump to help relieve pain of the  bunion.  -Informed surgical risk consent was reviewed and read aloud to the patient.  I reviewed the films.  I have discussed my findings with the patient in great detail.  I have discussed all risks including but not limited to infection, stiffness, scarring, limp, disability, deformity, damage to blood vessels and nerves, numbness, poor healing, need for braces, arthritis, chronic pain, amputation, death.  All benefits and realistic expectations discussed in great detail.  I have made no promises as to the outcome.  I have provided realistic expectations.  I have offered the patient a 2nd opinion, which they have declined and assured me they preferred to proceed despite the risks. -Will plan for surgery in the next couple months.   Procedure: Injection Tendon/Ligament Discussed alternatives, risks, complications and verbal consent was obtained.  Location: Right first metatarsophalangeal joint. Skin Prep: Alcohol. Injectate: 1cc 0.5% marcaine plain, 1 cc dexamethasone.  Disposition: Patient tolerated procedure well. Injection site dressed with a band-aid.  Post-injection care was discussed and return precautions discussed.     Louann Sjogren, DPM

## 2023-01-24 ENCOUNTER — Telehealth: Payer: Self-pay | Admitting: Urology

## 2023-01-24 NOTE — Telephone Encounter (Signed)
DOS - 02/21/23  SILVER BUNIONECTOMY RIGHT --- [id number] CHEILECTOMY RIGHT --- [id number]  [employer] EFFECTIVE DATE - 09/19/21   DEDUCTIBLE - $0.00 OOP - $6,500.00 W/ $6,320.85 REMAINING COINSURANCE - 0%  SPOKE WITH KEVIN WITH [employer] AND HE STATED THAT FOR CPT CODES 60454 AND 28289 NO PRIOR AUTH IS REQUIRED.  CALL REF # [id number]

## 2023-02-06 DIAGNOSIS — N951 Menopausal and female climacteric states: Secondary | ICD-10-CM | POA: Diagnosis not present

## 2023-02-09 ENCOUNTER — Telehealth: Payer: Self-pay

## 2023-02-09 NOTE — Telephone Encounter (Signed)
Received email from [facility] requesting I cancel her surgery with Dr. Ralene Cork on 02/21/2023. She did not give a reason for canceling. Notified Dr. Ralene Cork and Aram Beecham at [facility]

## 2023-03-02 ENCOUNTER — Encounter: Payer: Federal, State, Local not specified - PPO | Admitting: Podiatry

## 2023-03-16 ENCOUNTER — Encounter: Payer: Federal, State, Local not specified - PPO | Admitting: Podiatry

## 2023-03-20 ENCOUNTER — Ambulatory Visit (INDEPENDENT_AMBULATORY_CARE_PROVIDER_SITE_OTHER): Payer: Federal, State, Local not specified - PPO | Admitting: Family Medicine

## 2023-03-20 ENCOUNTER — Encounter: Payer: Self-pay | Admitting: Family Medicine

## 2023-03-20 VITALS — BP 115/64 | HR 91 | Temp 98.3°F | Resp 18 | Ht 62.0 in | Wt 149.7 lb

## 2023-03-20 DIAGNOSIS — L089 Local infection of the skin and subcutaneous tissue, unspecified: Secondary | ICD-10-CM | POA: Diagnosis not present

## 2023-03-20 DIAGNOSIS — T148XXA Other injury of unspecified body region, initial encounter: Secondary | ICD-10-CM | POA: Diagnosis not present

## 2023-03-20 MED ORDER — CEPHALEXIN 500 MG PO CAPS: 500.0000 mg | ORAL_CAPSULE | Freq: Two times a day (BID) | ORAL | 0 refills | Status: DC

## 2023-03-20 NOTE — Progress Notes (Signed)
   Established Patient Office Visit  Subjective   Patient ID: Krista Nunez, female    DOB: 01/28/1971  Age: 52 y.o. MRN: [id number]  Chief Complaint  Patient presents with  . Fall    Patient fell on right knee Saturday and states her knee is swollen and red, She states that she did put some nepsporin on her knee as well as some peroxide. She also states that her knee is now itching and warm to touch    HPI  Presents today for an acute visit with complaint of right knee injury, slipped on paint in parking lot. Did not fall directly onto knee. Concerned about open wound with redness and warm to touch. No pain, no decrease in ROM.  Symptoms have been present  since Saturday Associated symptoms include: redness and swelling Pertinent negatives: no fever or chills,  Pain severity: 0/10  Treatments tried include : cleaning, neosporin ointment Treatment effective : not effective    Review of Systems  Constitutional:  Negative for chills and fever.  Musculoskeletal:  Negative for joint pain.  Skin:        Right knee with erythema and warm to touch.       Objective:     BP 115/64   Pulse 91   Temp 98.3 F (36.8 C) (Oral)   Resp 18   Ht 5\' 2"  (1.575 m)   Wt 149 lb 11.2 oz (67.9 kg)   LMP 02/12/2017   SpO2 100%   BMI 27.38 kg/m    Physical Exam Vitals and nursing note reviewed.  Constitutional:      General: She is not in acute distress.    Appearance: Normal appearance.  Pulmonary:     Effort: Pulmonary effort is normal.  Musculoskeletal:     Left knee: Swelling and erythema present. No bony tenderness. Normal range of motion.     Comments: Superficial abrasion on right knee with surrounding erythema and warmth to touch. No purulence seen or reported.   Skin:    General: Skin is warm and dry.     Capillary Refill: Capillary refill takes less than 2 seconds.  Neurological:     General: No focal deficit present.     Mental Status: She is alert. Mental status is  at baseline.  Psychiatric:        Mood and Affect: Mood normal.        Behavior: Behavior normal.        Thought Content: Thought content normal.        Judgment: Judgment normal.    No results found for any visits on 03/20/23.    The 10-year ASCVD risk score (Arnett DK, et al., 2019) is: 1%    Assessment & Plan:   Problem List Items Addressed This Visit     Infected wound - Primary   Relevant Medications   cephALEXin (KEFLEX) 500 MG capsule  Wound care discussed. Gently clean with soap and water. Pat dry. Keep covered with clean dry bandage. Keep an eye for worsening and notify provider ASAP. Complete all of antibiotic.  Agrees with plan of care discussed.  Questions answered.   Return if symptoms worsen or fail to improve.    Novella Olive, FNP

## 2023-03-20 NOTE — Patient Instructions (Signed)
You were prescribed an antibiotic today. Please complete the full course.  Acetaminophen or ibuprofen for fever according to package instructions, do not exceed recommended doses.  Adequate fluids to avoid dehydration. Lots of rest while you are recovering.  Follow-up if your symptoms do not improve.     

## 2023-06-05 DIAGNOSIS — F419 Anxiety disorder, unspecified: Secondary | ICD-10-CM | POA: Diagnosis not present

## 2023-06-05 DIAGNOSIS — Z79890 Hormone replacement therapy (postmenopausal): Secondary | ICD-10-CM | POA: Diagnosis not present

## 2023-06-05 DIAGNOSIS — Z638 Other specified problems related to primary support group: Secondary | ICD-10-CM | POA: Diagnosis not present

## 2023-06-23 ENCOUNTER — Ambulatory Visit: Payer: Federal, State, Local not specified - PPO | Admitting: Podiatry

## 2023-06-23 ENCOUNTER — Encounter: Payer: Self-pay | Admitting: Podiatry

## 2023-06-23 DIAGNOSIS — M205X1 Other deformities of toe(s) (acquired), right foot: Secondary | ICD-10-CM

## 2023-06-23 DIAGNOSIS — M21611 Bunion of right foot: Secondary | ICD-10-CM

## 2023-06-23 MED ORDER — TRIAMCINOLONE ACETONIDE 10 MG/ML IJ SUSP
2.5000 mg | Freq: Once | INTRAMUSCULAR | Status: AC
  Administered 2023-06-23: 2.5 mg via INTRA_ARTICULAR

## 2023-06-23 MED ORDER — DEXAMETHASONE SODIUM PHOSPHATE 120 MG/30ML IJ SOLN
4.0000 mg | Freq: Once | INTRAMUSCULAR | Status: AC
  Administered 2023-06-23: 4 mg via INTRA_ARTICULAR

## 2023-06-23 NOTE — Progress Notes (Signed)
  Subjective:  Patient ID: Krista Nunez, female    DOB: 24-Oct-1970,   MRN: [id number]  Chief Complaint  Patient presents with   Foot Pain    Pt presents for a right foot pain follow up states she still gets pain in that foot.    52 y.o. female presents for follow-up of right foot bunion and hallux limitus.  . Relates she is still getting pain. She was scheduled to have surgery but had to take care of something else and returning to discuss again. Also requesting injection. Denies any current treatmetns . Denies any other pedal complaints. Denies n/v/f/c.   Past Medical History:  Diagnosis Date   Anxiety state 04/21/2016   With autonomic symptoms - palpitations   Leukopenia 04/21/2016   Menometrorrhagia 10/10/2022   Uterine leiomyoma 10/10/2022   Vitamin D deficiency 10/10/2022    Objective:  Physical Exam: Vascular: DP/PT pulses 2/4 bilateral. CFT <3 seconds. Normal hair growth on digits. No edema.  Skin. No lacerations or abrasions bilateral feet.  Musculoskeletal: MMT 5/5 bilateral lower extremities in DF, PF, Inversion and Eversion. Deceased ROM in DF of ankle joint.  Decreased Rom of the first MPJ. Tender to dorsum of first Mpj and on medial eminence. Mild HAV deformity noted.  Neurological: Sensation intact to light touch.   Assessment:   1. Hallux limitus of right foot   2. Bunion, right       Plan:  Patient was evaluated and treated and all questions answered. -Xrays reviewed not taken today. Will attempt to get them at next visit if needed.  -Discussed HAV and hallux limitus and treatment options;conservative and surgical management; risks, benefits, alternatives discussed. All patient's questions answered. -Discussed padding and wide shoe gear.   -Recommend continue with good supportive shoes and inserts.  -Injection offered today procedure below.  -Discussed surgical options. Patient relates she is ready to proceed with surgery and discussed trying minimal  treatmnet for now before proceeding with more permanent procedure like MPJ fusion. Discussed cheilectomy and discussed shaving of bump to help relieve pain of the bunion.  -Informed surgical risk consent was reviewed and read aloud to the patient.  I reviewed the films.  I have discussed my findings with the patient in great detail.  I have discussed all risks including but not limited to infection, stiffness, scarring, limp, disability, deformity, damage to blood vessels and nerves, numbness, poor healing, need for braces, arthritis, chronic pain, amputation, death.  All benefits and realistic expectations discussed in great detail.  I have made no promises as to the outcome.  I have provided realistic expectations.  I have offered the patient a 2nd opinion, which they have declined and assured me they preferred to proceed despite the risks. -Will plan for surgery around November 5th or 12th.   Procedure: Injection Tendon/Ligament Discussed alternatives, risks, complications and verbal consent was obtained.  Location: Right first metatarsophalangeal joint. Skin Prep: Alcohol. Injectate: 1cc 0.5% marcaine plain, 1 cc dexamethasone.  Disposition: Patient tolerated procedure well. Injection site dressed with a band-aid.  Post-injection care was discussed and return precautions discussed.     Louann Sjogren, DPM

## 2023-07-11 DIAGNOSIS — Z1231 Encounter for screening mammogram for malignant neoplasm of breast: Secondary | ICD-10-CM | POA: Diagnosis not present

## 2023-07-11 LAB — HM MAMMOGRAPHY

## 2023-07-12 ENCOUNTER — Telehealth: Payer: Self-pay | Admitting: Podiatry

## 2023-07-12 NOTE — Telephone Encounter (Signed)
DOS-08/08/2023  CHEILECTOMY [id number] SILVER BUNIONECTOMY [id number]  [employer] EFFECTIVE DATE-09/19/2022  DEDUCTIBLE- $0.00 WITH REMAINING $0.00 OOP- $6500.00 WITH REMAINING $6213.08 COINSURANCE- 0%  SPOKE WITH CIERA H. FROM [employer] AND SHE STATED THAT PRIOR AUTH IS NOT REQUIRED FOR CPT CODES 65784 AND [phone].  CALL REF #: CIERA H. 07/12/2023 @ 3:30 PM EST

## 2023-07-14 ENCOUNTER — Encounter: Payer: Self-pay | Admitting: Family Medicine

## 2023-07-25 ENCOUNTER — Encounter: Payer: Self-pay | Admitting: Podiatry

## 2023-07-31 ENCOUNTER — Ambulatory Visit: Payer: Federal, State, Local not specified - PPO | Admitting: Family Medicine

## 2023-07-31 VITALS — BP 151/78 | HR 89 | Temp 97.9°F | Resp 18 | Ht 62.0 in | Wt 142.4 lb

## 2023-07-31 DIAGNOSIS — K30 Functional dyspepsia: Secondary | ICD-10-CM | POA: Diagnosis not present

## 2023-07-31 DIAGNOSIS — R030 Elevated blood-pressure reading, without diagnosis of hypertension: Secondary | ICD-10-CM

## 2023-07-31 DIAGNOSIS — R079 Chest pain, unspecified: Secondary | ICD-10-CM

## 2023-07-31 NOTE — Progress Notes (Signed)
Acute Office Visit  Subjective:     Patient ID: Krista Nunez, female    DOB: 1970-10-06, 52 y.o.   MRN: [id number]  Chief Complaint  Patient presents with   Chest Pain    Chest Pain  Patient is in today for acute visit.  Pt reports she's had chest pains on and off since Saturday. She says she felt like it was gas pains. She didn't do anything for the pain. Lasted 10 mins on Saturday. She had just eaten an hour before the pain started. She ate a large pizza from [employer] She also reports stress as her daughter is going through custody with her child's father. They live in [place]. She reports she is stressed and wonder if this is why her blood pressure is high today as she doesn't have a hx of HTN.  She also reports pain with touching her chest. She does pilates and yoga.  Review of Systems  Cardiovascular:  Positive for chest pain.  All other systems reviewed and are negative.      Objective:    BP (!) 151/78   Pulse 89   Temp 97.9 F (36.6 C) (Oral)   Resp 18   Ht 5\' 2"  (1.575 m)   Wt 142 lb 6.4 oz (64.6 kg)   LMP 02/12/2017   SpO2 100%   BMI 26.05 kg/m    Physical Exam Vitals and nursing note reviewed.  Constitutional:      Appearance: Normal appearance. She is normal weight.  HENT:     Head: Normocephalic and atraumatic.     Right Ear: External ear normal.     Left Ear: External ear normal.     Nose: Nose normal.     Mouth/Throat:     Mouth: Mucous membranes are moist.     Pharynx: Oropharynx is clear.  Eyes:     Conjunctiva/sclera: Conjunctivae normal.     Pupils: Pupils are equal, round, and reactive to light.  Cardiovascular:     Rate and Rhythm: Normal rate and regular rhythm.     Pulses: Normal pulses.     Heart sounds: Normal heart sounds.  Pulmonary:     Effort: Pulmonary effort is normal.     Breath sounds: Normal breath sounds.  Skin:    General: Skin is warm.     Capillary Refill: Capillary refill takes less than 2 seconds.   Neurological:     General: No focal deficit present.     Mental Status: She is alert and oriented to person, place, and time. Mental status is at baseline.  Psychiatric:        Mood and Affect: Mood normal.        Behavior: Behavior normal.        Thought Content: Thought content normal.        Judgment: Judgment normal.   No results found for any visits on 07/31/23.      Assessment & Plan:   Problem List Items Addressed This Visit   None Visit Diagnoses     Chest pain, unspecified type    -  Primary   Relevant Orders   EKG 12-Lead     Chest pain, unspecified type -     EKG 12-Lead  Indigestion   EKG NSR with no abnormal findings. Chest pains could be from a number of various risks such as GERD (eating pizza before the pain started), doing pilates and exercising (having chest wall pain with palpation on today's exam), HTN (  elevated today) but she believes it stress. To monitor for now since EKG is normal. Given handout on GERD and food triggers. Advised pt to monitor blood pressures outside the office and given BP log today. To return in 2-3 weeks for blood pressure follow up and on chest pains.   No orders of the defined types were placed in this encounter.   No follow-ups on file.  Renetta Chalk, MD

## 2023-08-08 ENCOUNTER — Encounter: Payer: Self-pay | Admitting: Podiatry

## 2023-08-08 ENCOUNTER — Other Ambulatory Visit: Payer: Self-pay | Admitting: Podiatry

## 2023-08-08 DIAGNOSIS — M21611 Bunion of right foot: Secondary | ICD-10-CM | POA: Diagnosis not present

## 2023-08-08 DIAGNOSIS — M205X1 Other deformities of toe(s) (acquired), right foot: Secondary | ICD-10-CM | POA: Diagnosis not present

## 2023-08-08 DIAGNOSIS — G8918 Other acute postprocedural pain: Secondary | ICD-10-CM | POA: Diagnosis not present

## 2023-08-08 MED ORDER — ASPIRIN 81 MG PO TBEC: 81.0000 mg | DELAYED_RELEASE_TABLET | Freq: Two times a day (BID) | ORAL | Status: DC

## 2023-08-08 MED ORDER — ONDANSETRON HCL 4 MG PO TABS: 4.0000 mg | ORAL_TABLET | Freq: Three times a day (TID) | ORAL | 0 refills | Status: AC | PRN

## 2023-08-08 MED ORDER — CEPHALEXIN 500 MG PO CAPS: 500.0000 mg | ORAL_CAPSULE | Freq: Four times a day (QID) | ORAL | 0 refills | Status: DC

## 2023-08-08 MED ORDER — OXYCODONE-ACETAMINOPHEN 5-325 MG PO TABS: 1.0000 | ORAL_TABLET | ORAL | 0 refills | Status: AC | PRN

## 2023-08-16 ENCOUNTER — Other Ambulatory Visit: Payer: Self-pay | Admitting: Podiatry

## 2023-08-16 DIAGNOSIS — Z9889 Other specified postprocedural states: Secondary | ICD-10-CM

## 2023-08-16 NOTE — Progress Notes (Signed)
x

## 2023-08-16 NOTE — Progress Notes (Unsigned)
  Subjective:  Patient ID: Krista Nunez, female    DOB: 11/16/1970,  MRN: [id number]  No chief complaint on file.   DOS: 08/08/23 Procedure: Right foot silver bunionectomy and cheilectomy   52 y.o. female returns for POV#1. Relates doing well and managing pain  Review of Systems: Negative except as noted in the HPI. Denies N/V/F/Ch.  Past Medical History:  Diagnosis Date   Anxiety state 04/21/2016   With autonomic symptoms - palpitations   Leukopenia 04/21/2016   Menometrorrhagia 10/10/2022   Uterine leiomyoma 10/10/2022   Vitamin D deficiency 10/10/2022    Current Outpatient Medications:    Calcium Carb-Cholecalciferol (CALCIUM 1000 + D) 1000-20 MG-MCG TABS, , Disp: , Rfl:    cephALEXin (KEFLEX) 500 MG capsule, Take 1 capsule (500 mg total) by mouth 2 (two) times daily., Disp: 20 capsule, Rfl: 0   meloxicam (MOBIC) 15 MG tablet, Take 1 tablet (15 mg total) by mouth daily., Disp: 30 tablet, Rfl: 0   ondansetron (ZOFRAN) 4 MG tablet, Take 1 tablet (4 mg total) by mouth every 8 (eight) hours as needed for nausea or vomiting., Disp: 20 tablet, Rfl: 0   UNABLE TO FIND, Med Name: Femmenessence, Disp: , Rfl:   Social History   Tobacco Use  Smoking Status Never  Smokeless Tobacco Never    Allergies  Allergen Reactions   Sulfa Antibiotics    Objective:  There were no vitals filed for this visit. There is no height or weight on file to calculate BMI. Constitutional Well developed. Well nourished.  Vascular Foot warm and well perfused. Capillary refill normal to all digits.   Neurologic Normal speech. Oriented to person, place, and time. Epicritic sensation to light touch grossly present bilaterally.  Dermatologic Skin healing well without signs of infection. Skin edges well coapted without signs of infection.  Orthopedic: Tenderness to palpation noted about the surgical site.   Radiographs: Radiographs pending.  Assessment:   1. Post-operative state   2. Hallux  limitus of right foot   3. Bunion, right    Plan:  Patient was evaluated and treated and all questions answered.  S/p foot surgery right -Progressing as expected post-operatively. -WB Status: WBAT in CAM boot  -Sutures: intact. -Medications: n/a -Foot redressed.  Follow-up in 2 weeks for removal of sutures    No follow-ups on file.

## 2023-08-17 ENCOUNTER — Encounter: Payer: Self-pay | Admitting: Family Medicine

## 2023-08-17 ENCOUNTER — Ambulatory Visit: Payer: Federal, State, Local not specified - PPO | Admitting: Family Medicine

## 2023-08-17 ENCOUNTER — Ambulatory Visit (INDEPENDENT_AMBULATORY_CARE_PROVIDER_SITE_OTHER): Payer: Federal, State, Local not specified - PPO | Admitting: Podiatry

## 2023-08-17 ENCOUNTER — Ambulatory Visit: Payer: Federal, State, Local not specified - PPO

## 2023-08-17 ENCOUNTER — Encounter: Payer: Self-pay | Admitting: Podiatry

## 2023-08-17 VITALS — BP 138/77 | HR 70 | Temp 98.1°F | Resp 16 | Ht 62.0 in | Wt 142.0 lb

## 2023-08-17 DIAGNOSIS — M205X1 Other deformities of toe(s) (acquired), right foot: Secondary | ICD-10-CM

## 2023-08-17 DIAGNOSIS — M21611 Bunion of right foot: Secondary | ICD-10-CM

## 2023-08-17 DIAGNOSIS — Z23 Encounter for immunization: Secondary | ICD-10-CM

## 2023-08-17 DIAGNOSIS — R030 Elevated blood-pressure reading, without diagnosis of hypertension: Secondary | ICD-10-CM | POA: Diagnosis not present

## 2023-08-17 DIAGNOSIS — Z9889 Other specified postprocedural states: Secondary | ICD-10-CM

## 2023-08-17 DIAGNOSIS — M2011 Hallux valgus (acquired), right foot: Secondary | ICD-10-CM | POA: Diagnosis not present

## 2023-08-17 DIAGNOSIS — M19071 Primary osteoarthritis, right ankle and foot: Secondary | ICD-10-CM | POA: Diagnosis not present

## 2023-08-17 NOTE — Progress Notes (Signed)
   Established Patient Office Visit  Subjective   Patient ID: Krista Nunez, female    DOB: 01-22-1971  Age: 52 y.o. MRN: [id number]  Chief Complaint  Patient presents with   Medical Management of Chronic Issues    3 weeks fup on chest pain, pt reports it has gotten better    HPI  ELEVATED BLOOD PRESSURE Pt was seen a few weeks ago with chest pressure and elevated blood pressure. She was suspected of having indigestion. Advised her to monitor blood pressures at home and she is here for follow up. All readings lower than 140/90.  Pt has no further chest pains.   Pt also would like flu vaccine today.  Review of Systems  All other systems reviewed and are negative.     Objective:     BP (!) 175/72   Pulse 70   Temp 98.1 F (36.7 C) (Oral)   Resp 16   Ht 5\' 2"  (1.575 m)   Wt 142 lb (64.4 kg)   LMP 02/12/2017   SpO2 99%   BMI 25.97 kg/m  BP Readings from Last 3 Encounters:  08/17/23 138/77  07/31/23 (!) 151/78  03/20/23 115/64      Physical Exam Vitals and nursing note reviewed.  Constitutional:      Appearance: Normal appearance. She is normal weight.  HENT:     Head: Normocephalic and atraumatic.     Right Ear: External ear normal.     Left Ear: External ear normal.     Nose: Nose normal.     Mouth/Throat:     Mouth: Mucous membranes are moist.     Pharynx: Oropharynx is clear.  Eyes:     Conjunctiva/sclera: Conjunctivae normal.     Pupils: Pupils are equal, round, and reactive to light.  Cardiovascular:     Rate and Rhythm: Normal rate.  Pulmonary:     Effort: Pulmonary effort is normal.  Skin:    General: Skin is warm.     Capillary Refill: Capillary refill takes less than 2 seconds.  Neurological:     General: No focal deficit present.     Mental Status: She is alert and oriented to person, place, and time. Mental status is at baseline.  Psychiatric:        Mood and Affect: Mood normal.        Behavior: Behavior normal.        Thought  Content: Thought content normal.        Judgment: Judgment normal.     No results found for any visits on 08/17/23.     The 10-year ASCVD risk score (Arnett DK, et al., 2019) is: 5.6%    Assessment & Plan:   Problem List Items Addressed This Visit   None  Elevated blood pressure reading  Need for influenza vaccination -     Flu vaccine trivalent PF, 6mos and older(Flulaval,Afluria,Fluarix,Fluzone)   Blood pressures reviewed from home readings. All are at goal. Flu vaccine today. Suspect white coat syndrome. Will continue to monitor.  No follow-ups on file.    Suzan Slick, MD

## 2023-08-31 ENCOUNTER — Encounter: Payer: Self-pay | Admitting: Podiatry

## 2023-08-31 ENCOUNTER — Ambulatory Visit (INDEPENDENT_AMBULATORY_CARE_PROVIDER_SITE_OTHER): Payer: Federal, State, Local not specified - PPO | Admitting: Podiatry

## 2023-08-31 ENCOUNTER — Encounter: Payer: Federal, State, Local not specified - PPO | Admitting: Podiatry

## 2023-08-31 DIAGNOSIS — Z9889 Other specified postprocedural states: Secondary | ICD-10-CM

## 2023-08-31 NOTE — Progress Notes (Signed)
  Subjective:  Patient ID: Krista Nunez, female    DOB: Nov 12, 1970,  MRN: [id number]  No chief complaint on file.   DOS: 08/08/23 Procedure: Right foot silver bunionectomy and cheilectomy   52 y.o. female returns for POV#2. Relates doing well and no changes.   Review of Systems: Negative except as noted in the HPI. Denies N/V/F/Ch.  Past Medical History:  Diagnosis Date   Anxiety state 04/21/2016   With autonomic symptoms - palpitations   Leukopenia 04/21/2016   Menometrorrhagia 10/10/2022   Uterine leiomyoma 10/10/2022   Vitamin D deficiency 10/10/2022    Current Outpatient Medications:    Calcium Carb-Cholecalciferol (CALCIUM 1000 + D) 1000-20 MG-MCG TABS, , Disp: , Rfl:    meloxicam (MOBIC) 15 MG tablet, Take 1 tablet (15 mg total) by mouth daily., Disp: 30 tablet, Rfl: 0   ondansetron (ZOFRAN) 4 MG tablet, Take 1 tablet (4 mg total) by mouth every 8 (eight) hours as needed for nausea or vomiting., Disp: 20 tablet, Rfl: 0   UNABLE TO FIND, Med Name: Femmenessence, Disp: , Rfl:   Social History   Tobacco Use  Smoking Status Never  Smokeless Tobacco Never    Allergies  Allergen Reactions   Sulfa Antibiotics    Objective:  There were no vitals filed for this visit. There is no height or weight on file to calculate BMI. Constitutional Well developed. Well nourished.  Vascular Foot warm and well perfused. Capillary refill normal to all digits.   Neurologic Normal speech. Oriented to person, place, and time. Epicritic sensation to light touch grossly present bilaterally.  Dermatologic Skin healing well without signs of infection. Skin edges well coapted without signs of infection.  Orthopedic: Tenderness to palpation noted about the surgical site.   Radiographs: Radiographs with interval resection of medial and dorsal first MPJ.  Assessment:   1. Post-operative state    Plan:  Patient was evaluated and treated and all questions answered.  S/p foot surgery  right -Progressing as expected post-operatively. -WB Status: WBAT in CAM boot may begin transition to WBAT in regular shoes.  -Sutures: removed without incident.  -Medications: n/a -Foot redressed. May shower as normal starting tomorrow.   Follow-up in 3 weeks for recheck    No follow-ups on file.

## 2023-09-21 ENCOUNTER — Encounter: Payer: Self-pay | Admitting: Podiatry

## 2023-09-21 ENCOUNTER — Other Ambulatory Visit: Payer: Self-pay

## 2023-09-21 ENCOUNTER — Ambulatory Visit: Payer: Federal, State, Local not specified - PPO

## 2023-09-21 ENCOUNTER — Ambulatory Visit (INDEPENDENT_AMBULATORY_CARE_PROVIDER_SITE_OTHER): Payer: Federal, State, Local not specified - PPO | Admitting: Podiatry

## 2023-09-21 DIAGNOSIS — M7989 Other specified soft tissue disorders: Secondary | ICD-10-CM | POA: Diagnosis not present

## 2023-09-21 DIAGNOSIS — M19071 Primary osteoarthritis, right ankle and foot: Secondary | ICD-10-CM | POA: Diagnosis not present

## 2023-09-21 DIAGNOSIS — Z9889 Other specified postprocedural states: Secondary | ICD-10-CM

## 2023-09-21 DIAGNOSIS — R600 Localized edema: Secondary | ICD-10-CM | POA: Diagnosis not present

## 2023-09-21 DIAGNOSIS — M205X1 Other deformities of toe(s) (acquired), right foot: Secondary | ICD-10-CM

## 2023-09-21 NOTE — Progress Notes (Signed)
  Subjective:  Patient ID: Krista Nunez, female    DOB: [birth year],  MRN: [id number]  No chief complaint on file.   DOS: 08/08/23 Procedure: Right foot silver bunionectomy and cheilectomy   53 y.o. female returns for POV#3. Relates doing well and no changes.   Review of Systems: Negative except as noted in the HPI. Denies N/V/F/Ch.  Past Medical History:  Diagnosis Date   Anxiety state 04/21/2016   With autonomic symptoms - palpitations   Leukopenia 04/21/2016   Menometrorrhagia 10/10/2022   Uterine leiomyoma 10/10/2022   Vitamin D deficiency 10/10/2022    Current Outpatient Medications:    Calcium Carb-Cholecalciferol (CALCIUM 1000 + D) 1000-20 MG-MCG TABS, , Disp: , Rfl:    meloxicam (MOBIC) 15 MG tablet, Take 1 tablet (15 mg total) by mouth daily., Disp: 30 tablet, Rfl: 0   ondansetron (ZOFRAN) 4 MG tablet, Take 1 tablet (4 mg total) by mouth every 8 (eight) hours as needed for nausea or vomiting., Disp: 20 tablet, Rfl: 0   UNABLE TO FIND, Med Name: Femmenessence, Disp: , Rfl:   Social History   Tobacco Use  Smoking Status Never  Smokeless Tobacco Never    Allergies  Allergen Reactions   Sulfa Antibiotics    Objective:  There were no vitals filed for this visit. There is no height or weight on file to calculate BMI. Constitutional Well developed. Well nourished.  Vascular Foot warm and well perfused. Capillary refill normal to all digits.   Neurologic Normal speech. Oriented to person, place, and time. Epicritic sensation to light touch grossly present bilaterally.  Dermatologic Skin healing well without signs of infection. Skin edges well coapted without signs of infection.  Orthopedic: Tenderness to palpation noted about the surgical site.   Radiographs: Radiographs with interval resection of medial and dorsal first MPJ.  Assessment:   1. Post-operative state   2. Hallux limitus of right foot     Plan:  Patient was evaluated and treated and all  questions answered.  S/p foot surgery right -Progressing as expected post-operatively. -WB Status:WBAT in regular shoes. .  -Medications: n/a  Follow-up in 6 weeks for recheck    No follow-ups on file.

## 2023-10-12 ENCOUNTER — Encounter: Payer: Self-pay | Admitting: Family Medicine

## 2023-10-12 ENCOUNTER — Ambulatory Visit (INDEPENDENT_AMBULATORY_CARE_PROVIDER_SITE_OTHER): Payer: Federal, State, Local not specified - PPO | Admitting: Family Medicine

## 2023-10-12 VITALS — BP 130/84 | HR 98 | Temp 98.1°F | Resp 18 | Ht 62.0 in | Wt 137.5 lb

## 2023-10-12 DIAGNOSIS — Z136 Encounter for screening for cardiovascular disorders: Secondary | ICD-10-CM | POA: Diagnosis not present

## 2023-10-12 DIAGNOSIS — Z0000 Encounter for general adult medical examination without abnormal findings: Secondary | ICD-10-CM

## 2023-10-12 DIAGNOSIS — Z13220 Encounter for screening for lipoid disorders: Secondary | ICD-10-CM

## 2023-10-12 DIAGNOSIS — R7302 Impaired glucose tolerance (oral): Secondary | ICD-10-CM | POA: Diagnosis not present

## 2023-10-12 DIAGNOSIS — Z23 Encounter for immunization: Secondary | ICD-10-CM | POA: Diagnosis not present

## 2023-10-12 NOTE — Progress Notes (Signed)
 Complete physical exam  Patient: Krista Nunez   DOB: 06/02/1971   53 y.o. Female  MRN: [id number]  Subjective:    Chief Complaint  Patient presents with   Annual Exam    Patient is fasting    Krista Nunez is a 53 y.o. female who presents today for a complete physical exam. She reports consuming a general diet.     She generally feels well. She reports sleeping well. She does not have additional problems to discuss today.   Pt would like covid vaccine. Most recent fall risk assessment:    10/10/2022    8:34 AM  Fall Risk   Falls in the past year? 0  Number falls in past yr: 0  Injury with Fall? 0  Risk for fall due to : No Fall Risks  Follow up Falls evaluation completed     Most recent depression screenings:    10/10/2022    8:34 AM 05/09/2018    3:00 PM  PHQ 2/9 Scores  PHQ - 2 Score 1 2  PHQ- 9 Score 3 7    Vision:Within last year  Patient Active Problem List   Diagnosis Date Noted   Infected wound 03/20/2023   Vitamin D  deficiency 10/10/2022   Uterine leiomyoma 10/10/2022   Anemia 10/10/2022   Menometrorrhagia 10/10/2022   Leukopenia 04/21/2016   Anxiety state 04/21/2016   Past Medical History:  Diagnosis Date   Anxiety state 04/21/2016   With autonomic symptoms - palpitations   Leukopenia 04/21/2016   Menometrorrhagia 10/10/2022   Uterine leiomyoma 10/10/2022   Vitamin D  deficiency 10/10/2022   Past Surgical History:  Procedure Laterality Date   CESAREAN SECTION     Social History   Tobacco Use   Smoking status: Never   Smokeless tobacco: Never  Vaping Use   Vaping status: Never Used  Substance Use Topics   Alcohol use: No    Alcohol/week: 0.0 standard drinks of alcohol   Drug use: No   Family Status  Relation Name Status   Mother  Alive   Father  Deceased       December 27, 2017   Brother  (Not Specified)  No partnership data on file   Family History  Problem Relation Age of Onset   Hypertension Mother    Hypertension Father     Rheum arthritis Father    Hepatitis C Father    Lupus Father    Lung disease Brother        interstitial lung disease   Allergies  Allergen Reactions   Sulfa Antibiotics       Patient Care Team: Colette Torrence GRADE, MD as PCP - General (Family Medicine)   Outpatient Medications Prior to Visit  Medication Sig   Calcium Carb-Cholecalciferol (CALCIUM 1000 + D) 1000-20 MG-MCG TABS    estradiol (CLIMARA - DOSED IN MG/24 HR) 0.025 mg/24hr patch    meloxicam  (MOBIC ) 15 MG tablet Take 1 tablet (15 mg total) by mouth daily.   ondansetron  (ZOFRAN ) 4 MG tablet Take 1 tablet (4 mg total) by mouth every 8 (eight) hours as needed for nausea or vomiting.   Progesterone 100 MG SUPP    [DISCONTINUED] UNABLE TO FIND Med Name: Femmenessence   No facility-administered medications prior to visit.    Review of Systems  All other systems reviewed and are negative.        Objective:     BP 130/84   Pulse 98   Temp 98.1 F (36.7 C) (Oral)  Resp 18   Ht 5' 2 (1.575 m)   Wt 137 lb 8 oz (62.4 kg)   LMP 02/12/2017   SpO2 100%   BMI 25.15 kg/m  BP Readings from Last 3 Encounters:  10/12/23 130/84  08/17/23 138/77  07/31/23 (!) 151/78      Physical Exam Vitals and nursing note reviewed.  Constitutional:      Appearance: Normal appearance. She is normal weight.  HENT:     Head: Normocephalic and atraumatic.     Right Ear: Tympanic membrane, ear canal and external ear normal.     Left Ear: Tympanic membrane, ear canal and external ear normal.     Nose: Nose normal.     Mouth/Throat:     Mouth: Mucous membranes are moist.     Pharynx: Oropharynx is clear.  Eyes:     Conjunctiva/sclera: Conjunctivae normal.     Pupils: Pupils are equal, round, and reactive to light.  Cardiovascular:     Rate and Rhythm: Normal rate and regular rhythm.     Pulses: Normal pulses.     Heart sounds: Normal heart sounds.  Pulmonary:     Effort: Pulmonary effort is normal.     Breath sounds: Normal  breath sounds.  Abdominal:     General: Abdomen is flat. Bowel sounds are normal.  Skin:    General: Skin is warm.     Capillary Refill: Capillary refill takes less than 2 seconds.  Neurological:     General: No focal deficit present.     Mental Status: She is alert and oriented to person, place, and time. Mental status is at baseline.  Psychiatric:        Mood and Affect: Mood normal.        Behavior: Behavior normal.        Thought Content: Thought content normal.        Judgment: Judgment normal.     Results for orders placed or performed in visit on 10/12/23  HM PAP SMEAR  Result Value Ref Range   HM Pap smear normal   Results Console HPV  Result Value Ref Range   CHL HPV Negative    Last CBC Lab Results  Component Value Date   WBC 3.4 10/10/2022   HGB 13.0 10/10/2022   HCT 39.2 10/10/2022   MCV 85 10/10/2022   MCH 28.1 10/10/2022   RDW 13.0 10/10/2022   PLT 242 10/10/2022   Last metabolic panel Lab Results  Component Value Date   GLUCOSE 107 (H) 10/10/2022   NA 141 10/10/2022   K 4.7 10/10/2022   CL 101 10/10/2022   CO2 22 10/10/2022   BUN 33 (H) 10/10/2022   CREATININE 1.25 (H) 10/10/2022   EGFR 53 (L) 10/10/2022   CALCIUM 9.7 10/10/2022   PROT 7.3 10/10/2022   ALBUMIN 4.6 10/10/2022   LABGLOB 2.7 10/10/2022   AGRATIO 1.7 10/10/2022   BILITOT 0.2 10/10/2022   ALKPHOS 58 10/10/2022   AST 17 10/10/2022   ALT 19 10/10/2022   Last lipids Lab Results  Component Value Date   CHOL 204 (H) 10/10/2022   HDL 67 10/10/2022   LDLCALC 98 10/10/2022   TRIG 232 (H) 10/10/2022   CHOLHDL 3.0 10/10/2022   Last hemoglobin A1c Lab Results  Component Value Date   HGBA1C 5.9 (H) 10/10/2022        Assessment & Plan:    Routine Health Maintenance and Physical Exam  Immunization History  Administered Date(s) Administered  Influenza, Seasonal, Injecte, Preservative Fre 08/17/2023   Influenza,inj,Quad PF,6+ Mos 05/16/2019, 09/24/2022   Janssen (J&J)  SARS-COV-2 Vaccination 12/12/2019   Moderna SARS-COV2 Booster Vaccination 07/27/2020   Td 09/05/1994   Tdap 04/06/2017    Health Maintenance  Topic Date Due   Zoster Vaccines- Shingrix (1 of 2) Never done   MAMMOGRAM  07/10/2025   DTaP/Tdap/Td (3 - Td or Tdap) 04/07/2027   Cervical Cancer Screening (HPV/Pap Cotest)  10/11/2027   Colonoscopy  10/31/2032   INFLUENZA VACCINE  Completed   Hepatitis C Screening  Completed   HIV Screening  Completed   HPV VACCINES  Aged Out   COVID-19 Vaccine  Discontinued    Discussed health benefits of physical activity, and encouraged her to engage in regular exercise appropriate for her age and condition.  Problem List Items Addressed This Visit   None  No follow-ups on file. Annual physical exam  Encounter for lipid screening for cardiovascular disease -     Lipid panel  Impaired glucose tolerance -     CBC with Differential/Platelet -     Comprehensive metabolic panel -     Hemoglobin A1c  Need for COVID-19 vaccine -     PFIZER Comirnaty (GRAY TOP)COVID-19 Vaccine   Screening labs Covid booster See in 1 year sooner prn. UTD with all other preventive health screenings.    Torrence CINDERELLA Barrier, MD

## 2023-10-13 ENCOUNTER — Encounter: Payer: Self-pay | Admitting: Family Medicine

## 2023-10-13 DIAGNOSIS — D72819 Decreased white blood cell count, unspecified: Secondary | ICD-10-CM

## 2023-10-13 LAB — COMPREHENSIVE METABOLIC PANEL
ALT: 16 [IU]/L (ref 0–32)
AST: 23 [IU]/L (ref 0–40)
Albumin: 4.7 g/dL (ref 3.8–4.9)
Alkaline Phosphatase: 62 [IU]/L (ref 44–121)
BUN/Creatinine Ratio: 9 (ref 9–23)
BUN: 9 mg/dL (ref 6–24)
Bilirubin Total: 0.4 mg/dL (ref 0.0–1.2)
CO2: 26 mmol/L (ref 20–29)
Calcium: 9.9 mg/dL (ref 8.7–10.2)
Chloride: 102 mmol/L (ref 96–106)
Creatinine, Ser: 1.02 mg/dL — ABNORMAL HIGH (ref 0.57–1.00)
Globulin, Total: 3.1 g/dL (ref 1.5–4.5)
Glucose: 88 mg/dL (ref 70–99)
Potassium: 5 mmol/L (ref 3.5–5.2)
Sodium: 142 mmol/L (ref 134–144)
Total Protein: 7.8 g/dL (ref 6.0–8.5)
eGFR: 67 mL/min/{1.73_m2} (ref >59)

## 2023-10-13 LAB — HEMOGLOBIN A1C
Est. average glucose Bld gHb Est-mCnc: 126 mg/dL
Hgb A1c MFr Bld: 6 % — ABNORMAL HIGH (ref 4.8–5.6)

## 2023-10-13 LAB — CBC WITH DIFFERENTIAL/PLATELET
Basophils Absolute: 0 10*3/uL (ref 0.0–0.2)
Basos: 1 %
EOS (ABSOLUTE): 0 10*3/uL (ref 0.0–0.4)
Eos: 1 %
Hematocrit: 42.7 % (ref 34.0–46.6)
Hemoglobin: 13.7 g/dL (ref 11.1–15.9)
Immature Grans (Abs): 0 10*3/uL (ref 0.0–0.1)
Immature Granulocytes: 0 %
Lymphocytes Absolute: 1 10*3/uL (ref 0.7–3.1)
Lymphs: 45 %
MCH: 27.7 pg (ref 26.6–33.0)
MCHC: 32.1 g/dL (ref 31.5–35.7)
MCV: 86 fL (ref 79–97)
Monocytes Absolute: 0.3 10*3/uL (ref 0.1–0.9)
Monocytes: 15 %
Neutrophils Absolute: 0.8 10*3/uL — ABNORMAL LOW (ref 1.4–7.0)
Neutrophils: 38 %
Platelets: 297 10*3/uL (ref 150–450)
RBC: 4.95 x10E6/uL (ref 3.77–5.28)
RDW: 13.3 % (ref 11.7–15.4)
WBC: 2.2 10*3/uL — CL (ref 3.4–10.8)

## 2023-10-13 LAB — LIPID PANEL
Chol/HDL Ratio: 2.7 {ratio} (ref 0.0–4.4)
Cholesterol, Total: 151 mg/dL (ref 100–199)
HDL: 55 mg/dL (ref >39)
LDL Chol Calc (NIH): 78 mg/dL (ref 0–99)
Triglycerides: 99 mg/dL (ref 0–149)
VLDL Cholesterol Cal: 18 mg/dL (ref 5–40)

## 2023-10-16 DIAGNOSIS — Z6826 Body mass index (BMI) 26.0-26.9, adult: Secondary | ICD-10-CM | POA: Diagnosis not present

## 2023-10-16 DIAGNOSIS — Z79890 Hormone replacement therapy (postmenopausal): Secondary | ICD-10-CM | POA: Diagnosis not present

## 2023-10-16 DIAGNOSIS — Z1330 Encounter for screening examination for mental health and behavioral disorders, unspecified: Secondary | ICD-10-CM | POA: Diagnosis not present

## 2023-10-16 DIAGNOSIS — Z113 Encounter for screening for infections with a predominantly sexual mode of transmission: Secondary | ICD-10-CM | POA: Diagnosis not present

## 2023-10-16 DIAGNOSIS — Z01419 Encounter for gynecological examination (general) (routine) without abnormal findings: Secondary | ICD-10-CM | POA: Diagnosis not present

## 2023-10-30 ENCOUNTER — Other Ambulatory Visit: Payer: Self-pay | Admitting: Family

## 2023-10-30 DIAGNOSIS — D72819 Decreased white blood cell count, unspecified: Secondary | ICD-10-CM

## 2023-10-31 ENCOUNTER — Inpatient Hospital Stay: Payer: Federal, State, Local not specified - PPO | Admitting: Family

## 2023-10-31 ENCOUNTER — Other Ambulatory Visit: Payer: Self-pay

## 2023-10-31 ENCOUNTER — Inpatient Hospital Stay: Payer: Federal, State, Local not specified - PPO | Attending: Hematology & Oncology

## 2023-10-31 ENCOUNTER — Encounter: Payer: Self-pay | Admitting: Family

## 2023-10-31 VITALS — BP 145/70 | HR 97 | Temp 98.1°F | Resp 17 | Wt 144.1 lb

## 2023-10-31 DIAGNOSIS — D72819 Decreased white blood cell count, unspecified: Secondary | ICD-10-CM

## 2023-10-31 LAB — CBC WITH DIFFERENTIAL (CANCER CENTER ONLY)
Abs Immature Granulocytes: 0.01 10*3/uL (ref 0.00–0.07)
Basophils Absolute: 0 10*3/uL (ref 0.0–0.1)
Basophils Relative: 1 %
Eosinophils Absolute: 0 10*3/uL (ref 0.0–0.5)
Eosinophils Relative: 1 %
HCT: 38.9 % (ref 36.0–46.0)
Hemoglobin: 12.8 g/dL (ref 12.0–15.0)
Immature Granulocytes: 0 %
Lymphocytes Relative: 43 %
Lymphs Abs: 1.7 10*3/uL (ref 0.7–4.0)
MCH: 28.2 pg (ref 26.0–34.0)
MCHC: 32.9 g/dL (ref 30.0–36.0)
MCV: 85.7 fL (ref 80.0–100.0)
Monocytes Absolute: 0.5 10*3/uL (ref 0.1–1.0)
Monocytes Relative: 13 %
Neutro Abs: 1.6 10*3/uL — ABNORMAL LOW (ref 1.7–7.7)
Neutrophils Relative %: 42 %
Platelet Count: 261 10*3/uL (ref 150–400)
RBC: 4.54 MIL/uL (ref 3.87–5.11)
RDW: 13.3 % (ref 11.5–15.5)
WBC Count: 3.9 10*3/uL — ABNORMAL LOW (ref 4.0–10.5)
nRBC: 0 % (ref 0.0–0.2)

## 2023-10-31 LAB — CMP (CANCER CENTER ONLY)
ALT: 21 U/L (ref 0–44)
AST: 23 U/L (ref 15–41)
Albumin: 4.2 g/dL (ref 3.5–5.0)
Alkaline Phosphatase: 51 U/L (ref 38–126)
Anion gap: 9 (ref 5–15)
BUN: 16 mg/dL (ref 6–20)
CO2: 28 mmol/L (ref 22–32)
Calcium: 9.6 mg/dL (ref 8.9–10.3)
Chloride: 100 mmol/L (ref 98–111)
Creatinine: 0.8 mg/dL (ref 0.44–1.00)
GFR, Estimated: >60 mL/min (ref >60)
Glucose, Bld: 97 mg/dL (ref 70–99)
Potassium: 4.4 mmol/L (ref 3.5–5.1)
Sodium: 137 mmol/L (ref 135–145)
Total Bilirubin: 0.3 mg/dL (ref 0.0–1.2)
Total Protein: 7.8 g/dL (ref 6.5–8.1)

## 2023-10-31 LAB — SAVE SMEAR(SSMR), FOR PROVIDER SLIDE REVIEW

## 2023-10-31 LAB — LACTATE DEHYDROGENASE: LDH: 162 U/L (ref 98–192)

## 2023-10-31 NOTE — Progress Notes (Unsigned)
 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  She denies any issue with frequent or recurrent infections.  She notes persistent fatigue.  No adenopathy noted on exam. No lymphedema.  No organomegaly on abdominal exam.  No fever, chills, n/v, cough, rash, dizziness, SOB, chest pain, palpitations, abdominal pain or changes in bowel or bladder habits.  Her father and paternal aunt had Systemic Lupus Erythematosus. She is concerned she may also have. She has had a rash across both cheeks that comes and goes as well as joint pain and swelling. She will discuss work-up with her PCP.  No numbness or tingling in her extremities at this time.  No falls or syncope reported.  She required both blood and plasma transfusion support with birth of her daughter in the late 1990's. 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  She states that she had no issues with recovery post surgery.  She states that she has history of being positive for the Epstein-Barr virus.  No history of diabetes or thyroid disease.  No personal history of cancer. Her maternal grandmother had throat cancer and did have history of rubbing snuff.  She states that her mammogram in 07/2023 was negative. She notes that she does have history of dense breasts.  She had her colonoscopy in 10/2022 with [facility]. No report available but patient states it was negative and she will follow-up in 10 years.  No smoking, ETOH or recreational drug use.  Appetite and hydration are good. Weight is stable at 144 lbs.  She stays quite active and enjoys walking her dog Krista Nunez and doing yoga.   ROS: All other 10  point review of systems is negative.   PAST MEDICAL HISTORY:   Past Medical History:  Diagnosis Date   Anxiety state 04/21/2016   With autonomic symptoms - palpitations   Leukopenia 04/21/2016   Menometrorrhagia 10/10/2022   Uterine leiomyoma 10/10/2022   Vitamin D deficiency 10/10/2022    ALLERGIES: Allergies  Allergen Reactions   Sulfa Antibiotics       MEDICATIONS:  Current Outpatient Medications on File Prior to Visit  Medication Sig Dispense Refill   Calcium Carb-Cholecalciferol (CALCIUM 1000 + D) 1000-20 MG-MCG TABS      estradiol (CLIMARA - DOSED IN MG/24 HR) 0.025 mg/24hr patch      meloxicam (MOBIC) 15 MG tablet Take 1 tablet (15 mg total) by mouth daily. 30 tablet 0   ondansetron (ZOFRAN) 4 MG tablet Take 1 tablet (4 mg total) by mouth every 8 (eight) hours as needed for nausea or vomiting. 20 tablet 0   Progesterone 100 MG SUPP      No current facility-administered medications on file prior to visit.     PAST SURGICAL HISTORY Past Surgical History:  Procedure Laterality Date   CESAREAN SECTION      FAMILY HISTORY: Family History  Problem Relation Age of Onset   Hypertension Mother    Hypertension Father    Rheum arthritis Father    Hepatitis C Father    Lupus Father    Lung disease Brother  interstitial lung disease    SOCIAL HISTORY:  reports that she has never smoked. She has never used smokeless tobacco. She reports that she does not drink alcohol and does not use drugs.  PERFORMANCE STATUS: The patient's performance status is 0 - Asymptomatic  PHYSICAL EXAM: Most Recent Vital Signs: Last menstrual period 02/12/2017. BP (!) 145/70 (BP Location: Left Arm, Patient Position: Sitting)   Pulse 97   Temp 98.1 F (36.7 C) (Oral)   Resp 17   Wt 144 lb 1.9 oz (65.4 kg)   LMP 02/12/2017   SpO2 100%   BMI 26.36 kg/m   General Appearance:    Alert, cooperative, no distress, appears stated age  Head:    Normocephalic, without obvious  abnormality, atraumatic  Eyes:    PERRL, conjunctiva/corneas clear, EOM's intact, fundi    benign, both eyes        Throat:   Lips, mucosa, and tongue normal; teeth and gums normal  Neck:   Supple, symmetrical, trachea midline, no adenopathy;    thyroid:  no enlargement/tenderness/nodules; no carotid   bruit or JVD  Back:     Symmetric, no curvature, ROM normal, no CVA tenderness  Lungs:     Clear to auscultation bilaterally, respirations unlabored  Chest Wall:    No tenderness or deformity   Heart:    Regular rate and rhythm, S1 and S2 normal, no murmur, rub   or gallop     Abdomen:     Soft, non-tender, bowel sounds active all four quadrants,    no masses, no organomegaly        Extremities:   Extremities normal, atraumatic, no cyanosis or edema  Pulses:   2+ and symmetric all extremities  Skin:   Skin color, texture, turgor normal, no rashes or lesions  Lymph nodes:   Cervical, supraclavicular, and axillary nodes normal  Neurologic:   CNII-XII intact, normal strength, sensation and reflexes    throughout    LABORATORY DATA:  No results found for this or any previous visit (from the past 48 hours).    RADIOGRAPHY: No results found.     PATHOLOGY: None  ASSESSMENT/PLAN: Ms. 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  Blood work reviewed with Dr. Myna Hidalgo. No evidence of malignancy noted.  PAtient was encouraged to follow up with PCP to rule out possible LUpus based on symptoms mentioned above. Lupus can also be associated with leukopenia as well.  No intervention or follow-up indicated with our office at this time.   All questions were answered. The patient knows to call the clinic with any problems, questions or concerns. We can certainly see the patient again for any heme/onc issue that may arise.   The patient was discussed with Dr. Myna Hidalgo and he is in agreement with the aforementioned.   Eileen Stanford, NP

## 2023-11-02 ENCOUNTER — Encounter: Payer: Self-pay | Admitting: Podiatry

## 2023-11-02 ENCOUNTER — Other Ambulatory Visit: Payer: Self-pay

## 2023-11-02 ENCOUNTER — Ambulatory Visit: Payer: Federal, State, Local not specified - PPO | Admitting: Podiatry

## 2023-11-02 ENCOUNTER — Ambulatory Visit: Payer: Federal, State, Local not specified - PPO

## 2023-11-02 DIAGNOSIS — M7989 Other specified soft tissue disorders: Secondary | ICD-10-CM | POA: Diagnosis not present

## 2023-11-02 DIAGNOSIS — M79671 Pain in right foot: Secondary | ICD-10-CM

## 2023-11-02 DIAGNOSIS — Z9889 Other specified postprocedural states: Secondary | ICD-10-CM

## 2023-11-02 DIAGNOSIS — M205X1 Other deformities of toe(s) (acquired), right foot: Secondary | ICD-10-CM

## 2023-11-02 NOTE — Progress Notes (Signed)
  Subjective:  Patient ID: Krista Nunez, female    DOB: 14-Jul-1971,  MRN: [id number]  No chief complaint on file.   DOS: 08/08/23 Procedure: Right foot silver bunionectomy and cheilectomy   53 y.o. female returns for POV#4. Relates doing well and no changes.   Review of Systems: Negative except as noted in the HPI. Denies N/V/F/Ch.  Past Medical History:  Diagnosis Date   Anxiety state 04/21/2016   With autonomic symptoms - palpitations   Leukopenia 04/21/2016   Menometrorrhagia 10/10/2022   Uterine leiomyoma 10/10/2022   Vitamin D deficiency 10/10/2022    Current Outpatient Medications:    Calcium Carb-Cholecalciferol (CALCIUM 1000 + D) 1000-20 MG-MCG TABS, , Disp: , Rfl:    estradiol (CLIMARA - DOSED IN MG/24 HR) 0.025 mg/24hr patch, , Disp: , Rfl:    meloxicam (MOBIC) 15 MG tablet, Take 1 tablet (15 mg total) by mouth daily., Disp: 30 tablet, Rfl: 0   ondansetron (ZOFRAN) 4 MG tablet, Take 1 tablet (4 mg total) by mouth every 8 (eight) hours as needed for nausea or vomiting., Disp: 20 tablet, Rfl: 0   Progesterone 100 MG SUPP, , Disp: , Rfl:   Social History   Tobacco Use  Smoking Status Never  Smokeless Tobacco Never    Allergies  Allergen Reactions   Sulfa Antibiotics    Objective:  There were no vitals filed for this visit. There is no height or weight on file to calculate BMI. Constitutional Well developed. Well nourished.  Vascular Foot warm and well perfused. Capillary refill normal to all digits.   Neurologic Normal speech. Oriented to person, place, and time. Epicritic sensation to light touch grossly present bilaterally.  Dermatologic Skin healing well without signs of infection. Skin edges well coapted without signs of infection.  Orthopedic: Tenderness to palpation noted about the surgical site.   Radiographs: Radiographs with interval resection of medial and dorsal first MPJ.  Assessment:   No diagnosis found.   Plan:  Patient was  evaluated and treated and all questions answered.  S/p foot surgery right -Progressing as expected post-operatively. -WB Status:WBAT in regular shoes. .  -Medications: n/a  Patient discharged from surgical standpoint at this time.    No follow-ups on file.

## 2023-12-04 ENCOUNTER — Encounter: Payer: Self-pay | Admitting: Podiatry

## 2024-07-12 DIAGNOSIS — Z1231 Encounter for screening mammogram for malignant neoplasm of breast: Secondary | ICD-10-CM | POA: Diagnosis not present

## 2024-07-12 DIAGNOSIS — R92333 Mammographic heterogeneous density, bilateral breasts: Secondary | ICD-10-CM | POA: Diagnosis not present

## 2024-09-04 DIAGNOSIS — R928 Other abnormal and inconclusive findings on diagnostic imaging of breast: Secondary | ICD-10-CM | POA: Diagnosis not present

## 2024-09-04 DIAGNOSIS — R92332 Mammographic heterogeneous density, left breast: Secondary | ICD-10-CM | POA: Diagnosis not present
